# Patient Record
Sex: Female | Born: 1965 | Race: White | Hispanic: No | Smoking: Never smoker
Health system: Southern US, Community
[De-identification: ages and names within clinical notes are randomized; demographics above are authoritative.]

## PROBLEM LIST (undated history)

## (undated) DIAGNOSIS — F32A Depression, unspecified: Secondary | ICD-10-CM

## (undated) DIAGNOSIS — F419 Anxiety disorder, unspecified: Secondary | ICD-10-CM

## (undated) DIAGNOSIS — M25569 Pain in unspecified knee: Secondary | ICD-10-CM

## (undated) DIAGNOSIS — N289 Disorder of kidney and ureter, unspecified: Secondary | ICD-10-CM

## (undated) DIAGNOSIS — E785 Hyperlipidemia, unspecified: Secondary | ICD-10-CM

## (undated) DIAGNOSIS — G8929 Other chronic pain: Secondary | ICD-10-CM

## (undated) DIAGNOSIS — I10 Essential (primary) hypertension: Secondary | ICD-10-CM

## (undated) DIAGNOSIS — G43909 Migraine, unspecified, not intractable, without status migrainosus: Secondary | ICD-10-CM

## (undated) DIAGNOSIS — G709 Myoneural disorder, unspecified: Secondary | ICD-10-CM

## (undated) DIAGNOSIS — E119 Type 2 diabetes mellitus without complications: Secondary | ICD-10-CM

## (undated) DIAGNOSIS — M069 Rheumatoid arthritis, unspecified: Secondary | ICD-10-CM

## (undated) HISTORY — DX: Depression, unspecified: F32.A

## (undated) HISTORY — DX: Hyperlipidemia, unspecified: E78.5

## (undated) HISTORY — PX: ENDOMETRIAL ABLATION: SHX621

## (undated) HISTORY — PX: TUBAL LIGATION: SHX77

## (undated) HISTORY — DX: Essential (primary) hypertension: I10

## (undated) HISTORY — DX: Anxiety disorder, unspecified: F41.9

## (undated) HISTORY — DX: Migraine, unspecified, not intractable, without status migrainosus: G43.909

## (undated) HISTORY — DX: Myoneural disorder, unspecified: G70.9

## (undated) HISTORY — PX: RADIOFREQUENCY ABLATION NERVES: SUR1070

## (undated) HISTORY — DX: Type 2 diabetes mellitus without complications: E11.9

---

## 1998-05-18 ENCOUNTER — Encounter: Admission: RE | Admit: 1998-05-18 | Discharge: 1998-06-20 | Payer: Self-pay | Admitting: Family Medicine

## 1999-05-01 ENCOUNTER — Other Ambulatory Visit: Admission: RE | Admit: 1999-05-01 | Discharge: 1999-05-01 | Payer: Self-pay | Admitting: Obstetrics and Gynecology

## 2001-04-06 ENCOUNTER — Encounter: Payer: Self-pay | Admitting: Emergency Medicine

## 2001-04-06 ENCOUNTER — Emergency Department (HOSPITAL_COMMUNITY): Admission: EM | Admit: 2001-04-06 | Discharge: 2001-04-06 | Payer: Self-pay | Admitting: Emergency Medicine

## 2001-05-28 ENCOUNTER — Ambulatory Visit (HOSPITAL_COMMUNITY): Admission: RE | Admit: 2001-05-28 | Discharge: 2001-05-28 | Payer: Self-pay | Admitting: Obstetrics and Gynecology

## 2001-05-28 ENCOUNTER — Encounter: Payer: Self-pay | Admitting: Obstetrics and Gynecology

## 2001-06-05 ENCOUNTER — Inpatient Hospital Stay (HOSPITAL_COMMUNITY): Admission: AD | Admit: 2001-06-05 | Discharge: 2001-06-05 | Payer: Self-pay | Admitting: Obstetrics and Gynecology

## 2001-06-19 ENCOUNTER — Other Ambulatory Visit: Admission: RE | Admit: 2001-06-19 | Discharge: 2001-06-19 | Payer: Self-pay | Admitting: Obstetrics and Gynecology

## 2001-06-23 ENCOUNTER — Ambulatory Visit (HOSPITAL_COMMUNITY): Admission: RE | Admit: 2001-06-23 | Discharge: 2001-06-23 | Payer: Self-pay | Admitting: Obstetrics and Gynecology

## 2001-06-23 ENCOUNTER — Encounter (INDEPENDENT_AMBULATORY_CARE_PROVIDER_SITE_OTHER): Payer: Self-pay | Admitting: Specialist

## 2001-11-10 ENCOUNTER — Inpatient Hospital Stay (HOSPITAL_COMMUNITY): Admission: AD | Admit: 2001-11-10 | Discharge: 2001-11-10 | Payer: Self-pay | Admitting: Obstetrics and Gynecology

## 2002-06-30 ENCOUNTER — Ambulatory Visit (HOSPITAL_COMMUNITY): Admission: RE | Admit: 2002-06-30 | Discharge: 2002-06-30 | Payer: Self-pay | Admitting: Obstetrics and Gynecology

## 2002-06-30 ENCOUNTER — Encounter: Payer: Self-pay | Admitting: Obstetrics and Gynecology

## 2002-09-10 ENCOUNTER — Inpatient Hospital Stay (HOSPITAL_COMMUNITY): Admission: AD | Admit: 2002-09-10 | Discharge: 2002-09-10 | Payer: Self-pay | Admitting: Obstetrics & Gynecology

## 2002-10-22 ENCOUNTER — Other Ambulatory Visit: Admission: RE | Admit: 2002-10-22 | Discharge: 2002-10-22 | Payer: Self-pay | Admitting: Obstetrics and Gynecology

## 2002-12-31 ENCOUNTER — Encounter: Admission: RE | Admit: 2002-12-31 | Discharge: 2002-12-31 | Payer: Self-pay | Admitting: Gastroenterology

## 2002-12-31 ENCOUNTER — Encounter: Payer: Self-pay | Admitting: Gastroenterology

## 2003-02-25 ENCOUNTER — Inpatient Hospital Stay (HOSPITAL_COMMUNITY): Admission: AD | Admit: 2003-02-25 | Discharge: 2003-02-25 | Payer: Self-pay | Admitting: Obstetrics and Gynecology

## 2003-03-09 ENCOUNTER — Inpatient Hospital Stay (HOSPITAL_COMMUNITY): Admission: AD | Admit: 2003-03-09 | Discharge: 2003-03-09 | Payer: Self-pay | Admitting: Obstetrics and Gynecology

## 2003-03-14 ENCOUNTER — Inpatient Hospital Stay (HOSPITAL_COMMUNITY): Admission: AD | Admit: 2003-03-14 | Discharge: 2003-03-22 | Payer: Self-pay | Admitting: Obstetrics and Gynecology

## 2003-04-06 ENCOUNTER — Inpatient Hospital Stay (HOSPITAL_COMMUNITY): Admission: AD | Admit: 2003-04-06 | Discharge: 2003-04-06 | Payer: Self-pay | Admitting: Obstetrics and Gynecology

## 2003-04-07 ENCOUNTER — Inpatient Hospital Stay (HOSPITAL_COMMUNITY): Admission: AD | Admit: 2003-04-07 | Discharge: 2003-04-07 | Payer: Self-pay | Admitting: Obstetrics and Gynecology

## 2003-04-19 ENCOUNTER — Inpatient Hospital Stay (HOSPITAL_COMMUNITY): Admission: AD | Admit: 2003-04-19 | Discharge: 2003-04-23 | Payer: Self-pay | Admitting: Obstetrics and Gynecology

## 2003-04-24 ENCOUNTER — Encounter: Admission: RE | Admit: 2003-04-24 | Discharge: 2003-05-24 | Payer: Self-pay | Admitting: Obstetrics and Gynecology

## 2003-05-18 ENCOUNTER — Other Ambulatory Visit: Admission: RE | Admit: 2003-05-18 | Discharge: 2003-05-18 | Payer: Self-pay | Admitting: Obstetrics and Gynecology

## 2004-06-25 ENCOUNTER — Inpatient Hospital Stay (HOSPITAL_COMMUNITY): Admission: AD | Admit: 2004-06-25 | Discharge: 2004-06-26 | Payer: Self-pay | Admitting: Obstetrics and Gynecology

## 2004-06-29 ENCOUNTER — Emergency Department (HOSPITAL_COMMUNITY): Admission: EM | Admit: 2004-06-29 | Discharge: 2004-06-29 | Payer: Self-pay | Admitting: Emergency Medicine

## 2004-09-01 ENCOUNTER — Inpatient Hospital Stay (HOSPITAL_COMMUNITY): Admission: AD | Admit: 2004-09-01 | Discharge: 2004-09-01 | Payer: Self-pay | Admitting: Obstetrics & Gynecology

## 2004-09-21 ENCOUNTER — Inpatient Hospital Stay (HOSPITAL_COMMUNITY): Admission: AD | Admit: 2004-09-21 | Discharge: 2004-09-21 | Payer: Self-pay | Admitting: Obstetrics and Gynecology

## 2004-10-09 ENCOUNTER — Inpatient Hospital Stay (HOSPITAL_COMMUNITY): Admission: AD | Admit: 2004-10-09 | Discharge: 2004-10-09 | Payer: Self-pay | Admitting: Obstetrics and Gynecology

## 2004-10-14 ENCOUNTER — Inpatient Hospital Stay (HOSPITAL_COMMUNITY): Admission: AD | Admit: 2004-10-14 | Discharge: 2004-10-15 | Payer: Self-pay | Admitting: Obstetrics and Gynecology

## 2004-10-23 ENCOUNTER — Inpatient Hospital Stay (HOSPITAL_COMMUNITY): Admission: AD | Admit: 2004-10-23 | Discharge: 2004-10-26 | Payer: Self-pay | Admitting: Obstetrics & Gynecology

## 2004-11-25 ENCOUNTER — Emergency Department (HOSPITAL_COMMUNITY): Admission: EM | Admit: 2004-11-25 | Discharge: 2004-11-26 | Payer: Self-pay | Admitting: Emergency Medicine

## 2004-11-27 ENCOUNTER — Encounter: Admission: RE | Admit: 2004-11-27 | Discharge: 2004-11-27 | Payer: Self-pay | Admitting: Obstetrics and Gynecology

## 2005-08-13 ENCOUNTER — Encounter (INDEPENDENT_AMBULATORY_CARE_PROVIDER_SITE_OTHER): Payer: Self-pay | Admitting: Specialist

## 2005-08-13 ENCOUNTER — Ambulatory Visit (HOSPITAL_COMMUNITY): Admission: RE | Admit: 2005-08-13 | Discharge: 2005-08-13 | Payer: Self-pay | Admitting: Obstetrics and Gynecology

## 2006-01-10 ENCOUNTER — Ambulatory Visit: Payer: Self-pay | Admitting: Family Medicine

## 2006-01-13 ENCOUNTER — Ambulatory Visit: Payer: Self-pay | Admitting: Family Medicine

## 2006-01-15 ENCOUNTER — Ambulatory Visit: Payer: Self-pay | Admitting: Family Medicine

## 2006-01-30 ENCOUNTER — Ambulatory Visit: Payer: Self-pay | Admitting: Family Medicine

## 2006-02-12 ENCOUNTER — Ambulatory Visit (HOSPITAL_COMMUNITY): Admission: RE | Admit: 2006-02-12 | Discharge: 2006-02-12 | Payer: Self-pay | Admitting: Obstetrics and Gynecology

## 2006-05-05 ENCOUNTER — Encounter
Admission: RE | Admit: 2006-05-05 | Discharge: 2006-05-05 | Payer: Self-pay | Admitting: Physical Medicine and Rehabilitation

## 2006-11-04 ENCOUNTER — Telehealth (INDEPENDENT_AMBULATORY_CARE_PROVIDER_SITE_OTHER): Payer: Self-pay | Admitting: *Deleted

## 2006-11-24 ENCOUNTER — Encounter (INDEPENDENT_AMBULATORY_CARE_PROVIDER_SITE_OTHER): Payer: Self-pay | Admitting: Obstetrics and Gynecology

## 2006-11-24 ENCOUNTER — Ambulatory Visit (HOSPITAL_COMMUNITY): Admission: RE | Admit: 2006-11-24 | Discharge: 2006-11-24 | Payer: Self-pay | Admitting: Obstetrics and Gynecology

## 2007-02-02 ENCOUNTER — Ambulatory Visit: Payer: Self-pay | Admitting: Family Medicine

## 2007-02-02 DIAGNOSIS — K5909 Other constipation: Secondary | ICD-10-CM | POA: Insufficient documentation

## 2007-02-02 DIAGNOSIS — K5900 Constipation, unspecified: Secondary | ICD-10-CM | POA: Insufficient documentation

## 2007-02-02 LAB — CONVERTED CEMR LAB
Nitrite: NEGATIVE
Protein, U semiquant: NEGATIVE
WBC Urine, dipstick: NEGATIVE
pH: 6

## 2007-02-03 ENCOUNTER — Encounter (INDEPENDENT_AMBULATORY_CARE_PROVIDER_SITE_OTHER): Payer: Self-pay | Admitting: Family Medicine

## 2007-02-05 ENCOUNTER — Encounter (INDEPENDENT_AMBULATORY_CARE_PROVIDER_SITE_OTHER): Payer: Self-pay | Admitting: Family Medicine

## 2007-02-06 ENCOUNTER — Telehealth (INDEPENDENT_AMBULATORY_CARE_PROVIDER_SITE_OTHER): Payer: Self-pay | Admitting: *Deleted

## 2007-03-24 ENCOUNTER — Encounter (INDEPENDENT_AMBULATORY_CARE_PROVIDER_SITE_OTHER): Payer: Self-pay | Admitting: Family Medicine

## 2007-05-21 ENCOUNTER — Encounter: Admission: RE | Admit: 2007-05-21 | Discharge: 2007-05-21 | Payer: Self-pay | Admitting: Obstetrics and Gynecology

## 2007-06-26 ENCOUNTER — Telehealth (INDEPENDENT_AMBULATORY_CARE_PROVIDER_SITE_OTHER): Payer: Self-pay | Admitting: *Deleted

## 2007-06-26 ENCOUNTER — Telehealth: Payer: Self-pay | Admitting: Internal Medicine

## 2007-06-26 ENCOUNTER — Ambulatory Visit: Payer: Self-pay | Admitting: Family Medicine

## 2007-06-26 DIAGNOSIS — H6690 Otitis media, unspecified, unspecified ear: Secondary | ICD-10-CM | POA: Insufficient documentation

## 2007-11-09 ENCOUNTER — Telehealth (INDEPENDENT_AMBULATORY_CARE_PROVIDER_SITE_OTHER): Payer: Self-pay | Admitting: *Deleted

## 2008-03-23 ENCOUNTER — Ambulatory Visit: Payer: Self-pay | Admitting: Family Medicine

## 2008-03-28 ENCOUNTER — Telehealth (INDEPENDENT_AMBULATORY_CARE_PROVIDER_SITE_OTHER): Payer: Self-pay | Admitting: *Deleted

## 2008-03-28 ENCOUNTER — Emergency Department (HOSPITAL_COMMUNITY): Admission: EM | Admit: 2008-03-28 | Discharge: 2008-03-29 | Payer: Self-pay | Admitting: Emergency Medicine

## 2008-03-29 LAB — BASIC METABOLIC PANEL
BUN: 46 — AB (ref 4–21)
Chloride: 102 (ref 99–108)
Creatinine: 2.1 — AB (ref 0.5–1.1)
Glucose: 573
Potassium: 4 mEq/L (ref 3.5–5.1)
Sodium: 140 (ref 137–147)

## 2008-03-29 LAB — CBC AND DIFFERENTIAL
HCT: 45 (ref 36–46)
Hemoglobin: 15.3 (ref 12.0–16.0)

## 2008-07-25 ENCOUNTER — Ambulatory Visit: Payer: Self-pay | Admitting: Diagnostic Radiology

## 2008-07-25 ENCOUNTER — Ambulatory Visit (HOSPITAL_BASED_OUTPATIENT_CLINIC_OR_DEPARTMENT_OTHER): Admission: RE | Admit: 2008-07-25 | Discharge: 2008-07-25 | Payer: Self-pay | Admitting: Obstetrics and Gynecology

## 2009-08-08 ENCOUNTER — Ambulatory Visit: Payer: Self-pay | Admitting: Diagnostic Radiology

## 2009-08-08 ENCOUNTER — Ambulatory Visit (HOSPITAL_BASED_OUTPATIENT_CLINIC_OR_DEPARTMENT_OTHER): Admission: RE | Admit: 2009-08-08 | Discharge: 2009-08-08 | Payer: Self-pay | Admitting: Obstetrics and Gynecology

## 2010-05-27 ENCOUNTER — Encounter: Payer: Self-pay | Admitting: Obstetrics and Gynecology

## 2010-08-02 ENCOUNTER — Other Ambulatory Visit (HOSPITAL_BASED_OUTPATIENT_CLINIC_OR_DEPARTMENT_OTHER): Payer: Self-pay | Admitting: Obstetrics and Gynecology

## 2010-08-02 DIAGNOSIS — Z1231 Encounter for screening mammogram for malignant neoplasm of breast: Secondary | ICD-10-CM

## 2010-08-13 ENCOUNTER — Ambulatory Visit (HOSPITAL_BASED_OUTPATIENT_CLINIC_OR_DEPARTMENT_OTHER): Payer: Self-pay

## 2010-08-14 ENCOUNTER — Ambulatory Visit (HOSPITAL_BASED_OUTPATIENT_CLINIC_OR_DEPARTMENT_OTHER)
Admission: RE | Admit: 2010-08-14 | Discharge: 2010-08-14 | Disposition: A | Discharge status: Home / Self Care | Payer: BC Managed Care – HMO | Source: Ambulatory Visit | Attending: Obstetrics and Gynecology | Admitting: Obstetrics and Gynecology

## 2010-08-14 DIAGNOSIS — Z1231 Encounter for screening mammogram for malignant neoplasm of breast: Secondary | ICD-10-CM | POA: Insufficient documentation

## 2010-09-18 NOTE — Op Note (Signed)
NAME:  Angelica Butler, Angelica Butler NO.:  [id number]   MEDICAL RECORD NO.:  [id number]          PATIENT TYPE:  AMB   LOCATION:  SDC                           FACILITY:  [facility]   PHYSICIAN:  Lenoard Aden, M.D.DATE OF BIRTH:  [birth year]   DATE OF PROCEDURE:  11/24/2006  DATE OF DISCHARGE:                               OPERATIVE REPORT   PREOPERATIVE DIAGNOSIS:  Menorrhagia refractory to hormonal therapy.  History of tubal ligation.   POSTOPERATIVE DIAGNOSIS:  Menorrhagia refractory to hormonal therapy.  History of tubal ligation.   PROCEDURE:  Diagnostic hysteroscopy, D&C, NovaSure endometrial ablation.   SURGEON:  Lenoard Aden, M.D.   ASSISTANT:  None.   ANESTHESIA:  General.   ESTIMATED BLOOD LOSS:  Less than 50 mL.   COMPLICATIONS:  None.   FLUID DEFICIT:  100 mL, endometrial curettings specimen to pathology.   DESCRIPTION OF PROCEDURE:  After being apprised of risks of anesthesia,  infection, bleeding, injury to bowel, bladder with possible need for  repair, uterine perforation, possible need for repair, the patient was  taken to the operating. She was administered general anesthetic without  complications, prepped and draped in sterile fashion.  Feet placed in  the yellow fin stirrups.  Exam under anesthesia revealed a small  anteflexed to mid position uterus.  No adnexal masses at this time. The  cervix is dilated up to #23 [facility] dilator, measurements taken.  Uterus  sounds to 7 cm.  Hysteroscope placed.  Visualization revealed thickened  endometrium.  No structural defects. D&C performed with a sharp  endometrial curettage in four quadrant method.  Revisualization reveals  no evidence of structural abnormalities in the normal appearing cavity.  NovaSure is placed. Device is set per readings as noted with a width of  2.7 cm and a length of 5 cm. At this time the procedure is initiated  after the CO2 test is negative.   Procedure is performed without  difficulty with the entire length of the  procedure lasting 1 minute and 2 seconds.  Revisualization hysteroscope  reveals the cavity to be without evidence of perforation and with a well  ablated endometrial lining, all instruments removed.  No bleeding from  the cervix is noted.  The patient is awakened and transferred to  recovery in good condition.     Lenoard Aden, M.D.  Electronically Signed    RJT/MEDQ  D:  11/24/2006  T:  11/24/2006  Job:  [id number]

## 2010-09-21 NOTE — Op Note (Signed)
NAME:  Angelica Butler, Angelica Butler NO.:  [id number]   MEDICAL RECORD NO.:  [id number]          PATIENT TYPE:  AMB   LOCATION:  SDC                           [facility]   PHYSICIAN:  Lenoard Aden, M.D.DATE OF BIRTH:  1965-12-18   DATE OF PROCEDURE:  08/13/2005  DATE OF DISCHARGE:                                 OPERATIVE REPORT   PREOPERATIVE DIAGNOSIS:  Dysfunction uterine bleeding with secondary anemia.   POSTOPERATIVE DIAGNOSIS:  Dysfunction uterine bleeding with secondary anemia  plus endometrial polyps.   PROCEDURE:  Diagnostic hysteroscopy, dilatation and curettage, resectoscopic  polypectomy.   SURGEON:  Lenoard Aden, M.D.   ANESTHESIA:  General.   ESTIMATED BLOOD LOSS:  Less than 50 cc.   COMPLICATIONS:  None.   DRAINS:  None.   COUNTS:  Correct.   Patient to recovery in good condition.   BRIEF OPERATIVE NOTE:  After being apprised the risks of anesthesia,  infection, bleeding, uterine perforation, and possible need for repair, the  patient was taken to the operating room, where she was administered general  anesthetic.  No complications.  Prepped and draped usual sterile fashion.  A  catheter was inserted__________ into the bladder and was emptied after  achieving adequate anesthesia.  The patient was straight-cathed until the  bladder was empty.  A single-tooth tenaculum was placed to the anterior lip  of the cervix.  Dilute Nesacaine solution placed, standard paracervical  block.  The cervix was easily dilated up to a #31 Pratt dilator.  Hysteroscope placed.  Visualization revealed some multiple small endometrial  polyps along the anterior uterine wall, which were resected without  difficulty.  D&C is performed.  Repeat visualization reveals empty  endometrial cavity, fluid deficit to 125 cc.  The patient tolerated the  procedure well, was awakened and transferred to recovery in good condition.      Lenoard Aden, M.D.  Electronically Signed     RJT/MEDQ  D:  08/13/2005  T:  08/13/2005  Job:  [id number]

## 2010-09-21 NOTE — Op Note (Signed)
[facility]  Patient:    Angelica Butler, Angelica Butler Visit Number: [id number] MRN: [id number]          Service Type: DSU Location: [facility] Attending Physician:  Michael Litter Dictated by:   Jaymes Graff, M.D. Proc. Date: 06/23/01 Admit Date:  06/23/2001                             Operative Report  PREOPERATIVE DIAGNOSIS:       Missed abortion at 10+ weeks.  POSTOPERATIVE DIAGNOSIS:      Missed abortion at 10+ weeks.  OPERATION:                    Dilatation and evacuation.  SURGEON:                      Leona Carry, M.D.  ASSISTANT:  ANESTHESIA:                   MAC.  ESTIMATED BLOOD LOSS:         Minimal.  IV FLUIDS:                    1 liter of crystalloid.  COMPLICATIONS:                None.  FINDINGS:                     8-week size anteverted uterus with no adnexal masses.  Normal vulvovaginal examination.  Products of conception sent to pathology.  DESCRIPTION OF PROCEDURE:     The patient was taken to the operating room where she was given MAC anesthesia and 10 cc of 1% lidocaine cervical block and placed in the dorsal lithotomy position and prepped and draped in the usual sterile fashion.  A bivalve speculum was placed into the vagina. The anterior lip of the cervix was grasped with a single tooth tenaculum.  The cervix was then dilated with Shawnie Pons dilators to a size 21.  A size 7 suction curettage was placed into the uterus and done until a gritty texture was noted. The suction curettage was removed and a sharp curet was then placed into the uterus and curetted until a gritty texture was noted.  The curet was then placed once again into the uterus to evacuate all blood and no other products of conception was seen.  All instruments were removed from the vagina. The tenaculum was removed with good hemostasis noted.  Sponge, needle, and instrument counts were correct x 2.  The patient went to the recovery room in stable  condition. Dictated by:   Jaymes Graff, M.D. Attending Physician:  Michael Litter DD:  06/23/01 TD:  06/23/01 Job: [id number]

## 2010-09-21 NOTE — Discharge Summary (Signed)
NAME:  Angelica Butler, Angelica Butler                 ACCOUNT NO.:  [id number]   MEDICAL RECORD NO.:  [id number]          PATIENT TYPE:  INP   LOCATION:  9108                          [facility]ILITY[facility]   PHYSICIAN:  Lenoard Aden, M.D.DATE OF BIRTH:  [birth year]   DATE OF ADMISSION:  10/23/2004  DATE OF DISCHARGE:  10/26/2004                                 DISCHARGE SUMMARY   The patient underwent induction for gestational hypertension, now at term.  The patient, during labor, evolved with a compound presentation with a hand  presentation that was nonreducible.  She underwent urgent primary C section  without complications.  Postop course uncomplicated.  Discharged to home day  #3.  Tylox and prenatal vitamins given.  Follow up in the office in 4-6  weeks.      Lenoard Aden, M.D.  Electronically Signed     RJT/MEDQ  D:  12/31/2004  T:  12/31/2004  Job:  [id number]   cc:   Floyde Parkins

## 2010-09-21 NOTE — H&P (Signed)
NAME:  Angelica Butler, Angelica Butler                            ACCOUNT NO.:  [id number]   MEDICAL RECORD NO.:  [id number]                   PATIENT TYPE:  INP   LOCATION:  9172                                 [facility]ILITY[facility]   PHYSICIAN:  Lenoard Aden, M.D.             DATE OF BIRTH:  [birth year]   DATE OF ADMISSION:  04/19/2003  DATE OF DISCHARGE:                                HISTORY & PHYSICAL   CHIEF COMPLAINT:  Mild preeclampsia, for induction.   HISTORY OF PRESENT ILLNESS:  The patient is a 45 year old white female (G3,  P0) with an EDD of May 14, 2003; with stable, mild preeclampsia since 32  weeks.  She presents now at 36 weeks for induction.   ALLERGIES:  NO KNOWN DRUG ALLERGIES.   MEDICATIONS:  Prenatal vitamins.   PAST MEDICAL HISTORY:  History of complete AB, SAB and D&E in 2002 and 2003.  She has history of anxiety and depression.  History of liposuction.  History  of medication for anxiety and depression.   FAMILY HISTORY:  Heart disease, blood clots, breast cancer and psychiatric  disease.   PRENATAL LABS:  Blood type 0 positive.  Rh antibody negative.  Rubella  immune.  Hepatitis surface antigen negative.  HIV nonreactive.   PHYSICAL EXAMINATION:  GENERAL:  She is a well developed, well nourished  white female; no acute distress.  HEENT:  Normal.  LUNGS:  Clear.  HEART:  Regular rate and rhythm.  ABDOMEN:  Soft, gravid and nontender.  FETAL:  Estimated fetal weight 6 pounds.  GENITOURINARY:  Cervix is 2 cm, 80% vertex, minus 1.  EXTREMITIES:  Without cords.  NEUROLOGIC:  Nonfocal.   IMPRESSION:  Mild preeclampsia, now at term.   PLAN:  Proceed with cervical ripening and induction.                                               Lenoard Aden, M.D.    RJT/MEDQ  D:  04/20/2003  T:  04/20/2003  Job:  [id number]

## 2010-09-21 NOTE — Assessment & Plan Note (Signed)
[facility]                                   ON-CALL NOTE   NAME:Adrian, Angelica Butler                        MRN:          [id number]  DATE:01/24/2006                            DOB:          October 23, 1965    On-call note for [name], now an Angelica Butler patient.  The patient's date  of birth is [birth year], [phone], about 5:20 on the 21st.  The patient  calls because her primary doctor's nurses called her, before she picked up  on the last ring, and called and left a message to say they need to know her  pharmacy number so they can call in a prescription for her pain, as she is  under evaluation for fever, back pain and other issues, and when she called  the office back she could not get through, of course, because the phones  were closed by then.  I called the [facility] office back number, and they  will call her directly at this number to make appropriate arrangements for  her prescription.                                   Neta Mends. Fabian Sharp, MD   WKP/MedQ  DD:  01/24/2006  DT:  01/28/2006  Job #:  [id number]   cc:   Leanne Chang, M.D.

## 2010-09-21 NOTE — H&P (Signed)
[facility]  Patient:    Angelica Butler, Angelica Butler Visit Number: [id number] MRN: [id number]          Service Type: Attending:  Naima A. Normand Sloop, M.D. Dictated by:   Pierre Bali. Normand Sloop, M.D. Adm. Date:  06/23/01                           History and Physical  OFFICE NUMBER:                [id number].  HISTORY OF PRESENT ILLNESS:   The patient is a 45 year old, G2, P0-0-1-0, with an estimated date of confinement of January 15, 2002, by early ultrasound, estimated gestational age of [redacted] weeks 4 days.  The patient had an ultrasound on June 19, 2001, secondary to not being able to auscultate fetal heart tones during exam.  She was then diagnosed with a missed abortion of twins. Neither twin had a heartbeat, and she had a previous ultrasound showing fetal cardiac activity.  The patient had no vaginal bleeding or cramping.  PAST OBSTETRIC HISTORY:       1. Spontaneous abortion on the first trimester.                               2. No history of D&E.  PAST GYNECOLOGIC HISTORY:     1. She had menarche at age 85, occurring every                                  month, lasting for five days.                               2. No history of sexual transmitted diseases or                                  abnormal Pap smear.  PAST MEDICAL HISTORY:         Depression.  MEDICATIONS:                  1. Celexa.                               2. Folic acid.  ALLERGIES:                    No known drug allergies.  SOCIAL HISTORY:               Negative x 3.  FAMILY HISTORY:               Paternal grandmother with ovarian and breast cancer.  Also family history significant for hypertension.  No diabetes.  PHYSICAL EXAMINATION:  VITAL SIGNS:                  Blood pressure 120/86.  The patient is crying secondary to the emotional distress of her loss.  HEART:                        Regular.  LUNGS:  Clear to auscultation bilaterally.  ABDOMEN:                       Nondistended, soft, and nontender.  PELVIC:                       Vulva within normal limits.  Patient refuses speculum exam.  Uterus is eight week size, nontender.  No adnexal masses palpated.  Cervix is long and closed.  EXTREMITIES:                  No clubbing, cyanosis, or edema.  LABORATORY DATA:              Her blood type is O positive, antibody negative. Hemoglobin 13.5, platelet count 196.  She had an ultrasound here on June 19, 2001, which crown-rump length measured only 7 weeks 2 days with no fetal heart activity.  Right ovary was 2.7 x 2.0 x 2.0 cm.  The left ovary was not seen.  Twin B also had no fetal cardiac activity, measuring 7 weeks two days, and both twins seemed amorphus in shape.  ASSESSMENT AND PLAN:          The patient was told that she had a missed abortion and given the option of expectant management versus D&E, the patient chose to have a D&E.  She understands that the risks are but not limited to bleeding, infection, perforation of the uterus, and Asherman syndrome.  The patient still chose to have a D&E and understands the risks involved. Dictated by:   Pierre Bali. Normand Sloop, M.D. Attending:  Naima A. Dillard, M.D. DD:  06/23/01 TD:  06/23/01 Job: [id number]

## 2010-09-21 NOTE — Consult Note (Signed)
   NAME:  Angelica Butler, Angelica Butler                            ACCOUNT NO.:  [id number]   MEDICAL RECORD NO.:  [id number]                   PATIENT TYPE:  MAT   LOCATION:  MATC                                 [facility]ILITY:  [facility]   PHYSICIAN:  Lenoard Aden, M.D.             DATE OF BIRTH:  [birth year]   DATE OF CONSULTATION:  03/09/2003  DATE OF DISCHARGE:                                   CONSULTATION   CHIEF COMPLAINT:  Increased blood pressure and shortness of breath.   HISTORY OF PRESENT ILLNESS:  The patient is a 45 year old white female G3,  P0 at 30-4/7ths' weeks who presents to the office today with facial edema,  proteinuria, slightly elevated blood pressure and shortness of breath.   PAST OBSTETRICAL HISTORY:  Remarkable for SAB x2.   ALLERGIES:  NO KNOWN DRUG ALLERGIES.   MEDICATIONS:  Celexa and prenatal vitamins.   PAST MEDICAL HISTORY:  Remarkable for anxiety and depression.  No other  medical or surgical hospitalizations.  Personal history of liposuction.   FAMILY HISTORY:  Depression, myocardial infarction, venous thromboembolism.   PRENATAL LABORATORY DATA:  Blood type 0 positive, RPR negative, Rubella  immune, hepatitis and HIV negative.   PHYSICAL EXAMINATION:  VITAL SIGNS:  Blood pressure 120-130's/80-89.  GENERAL:  She is a well-developed, well nourished white female in no acute  distress.  HEENT:  Normal.  LUNGS:  Clear.  CARDIOVASCULAR:  Regular rate and rhythm.  ABDOMEN:  Soft, gravid, nontender. No right upper quadrant tenderness.  EXTREMITIES: With 2+ pretibial edema noted.  DTRs 2+, no evidence of clonus.  PELVIC:  Deferred.  NEUROLOGIC:  Nonfocal.   LABORATORY DATA:  CBC revealed white blood cell count 8.1, hemoglobin 11.3,  platelet count 154,000. SGOT, PT within normal limits. Creatinine 0.6, LDH  109, uric acid 2.9. NST reactive.  No evidence of contractions are noted.   IMPRESSION:  1. A 30-4/7ths' weeks intrauterine pregnancy.  2. No evidence of  preeclampsia with normal labs and normal serial blood     pressures.  3. Anxiety/depression, stable.  4. Probable upper respiratory infection.   PLAN:  Treat with Z-pak and Tussionex for her cough. Continue Celexa. Start  24 hour urine.  Discontinue work x24-48 hours. Follow up in the office in 48  hours.  Preeclamptic precautions given. Will check 24 hour urine upon  presentation at the next visit.  Hypertensive precautions given.                                               Lenoard Aden, M.D.    RJT/MEDQ  D:  03/09/2003  T:  03/09/2003  Job:  [id number]   cc:   [facility]

## 2010-09-21 NOTE — H&P (Signed)
NAME:  Angelica Butler, Angelica Butler                 ACCOUNT NO.:  [id number]   MEDICAL RECORD NO.:  [id number]          PATIENT TYPE:  INP   LOCATION:  9174                          [facility]ACILITY:  [facility]   PHYSICIAN:  Lenoard Aden, M.D.DATE OF BIRTH:  1965/08/05   DATE OF ADMISSION:  10/23/2004  DATE OF DISCHARGE:                                HISTORY & PHYSICAL   CHIEF COMPLAINT:  Worsening headache, history of PIH, for induction.   HISTORY OF PRESENT ILLNESS:  She is a 45 year old white female, G4, P28, EDD  of November 08, 2004, with a multitude of chronic problems during pregnancy, to  include chronic recurrent headache, cough, back pain, and mild elevation of  blood pressure, for induction now at term.   She has a history of a spontaneous vaginal delivery, a vacuum-assisted  vaginal delivery in 2004, SAB x2, history of liposuction in 2002, history of  pregnancy-induced hypertension with her first pregnancy.   She has a family history of diabetes and hypertension and alcohol abuse, a  family history of bipolar disorder.   PRENATAL LABORATORY DATA:  Blood type of O positive.  Rubella immune.  Hepatitis, HIV negative.  GBS is negative.   PHYSICAL EXAMINATION:  GENERAL:  She is a well-developed, well-nourished  white female in no acute distress.  HEENT:  Normal.  CHEST:  Lungs clear.  CARDIAC:  Regular rhythm.  ABDOMEN:  Soft, gravid, nontender.  Estimated fetal weight 7 pounds.  PELVIC:  Cervix is 2 cm, 50%, vertex, -2.  EXTREMITIES:  No cords.  NEUROLOGIC:  Exam is nonfocal.   IMPRESSION:  1.  A 38-week intrauterine pregnancy.  2.  Chronic recurrent refractory headache, intractable, with no response to      medications and negative workup noted.   PLAN:  Proceed with induction.  Risks and benefits discussed.  Epidural as  needed.       RJT/MEDQ  D:  10/23/2004  T:  10/23/2004  Job:  [id number]

## 2010-09-21 NOTE — Op Note (Signed)
NAME:  EVERLEAN, BUCHER NO.:  [id number]   MEDICAL RECORD NO.:  [id number]          PATIENT TYPE:  AMB   LOCATION:  SDC                           [facility]   PHYSICIAN:  Lenoard Aden, M.D.DATE OF BIRTH:  1965-07-11   DATE OF PROCEDURE:  02/12/2006  DATE OF DISCHARGE:                                 OPERATIVE REPORT   PREOPERATIVE DIAGNOSIS:  Elective sterilization.   POSTOPERATIVE DIAGNOSIS:  Elective sterilization.   PROCEDURE:  Laparoscopic tubal sterilization.   SURGEON:  Lenoard Aden, M.D.   ANESTHESIA:  General.   ESTIMATED BLOOD LOSS:  Less than 50 cc.   COMPLICATIONS:  None.   DRAINS:  None.   COUNTS:  Correct.   Patient to recovery in good condition.   OPERATIVE NOTE:  After being apprised of risks of anesthesia, infection,  bleeding, intra-abdominal organs need for repair, delayed versus immediate  complications, to include bowel and bladder injury with possible need for  repair, failure risk of tubal ligation, 5-02/999, irreversibility of the  procedure, patient is brought to the operating room, where she is  administered general anesthetic without complications.  Prepped and draped  in the usual sterile fashion.  Feet are placed in the yellow fin stirrups.  Hulka tenaculum placed per vagina.  An infraumbilical incision made with the  scalpel after placement of dilute Marcaine solution.  A Veress needle  placed.  Opening pressure of -1 noted, and 3.5 liters of CO2 insufflated  without difficulty.  A trocar placed.  Atraumatic trocar entry noted.  Liver  and gallbladder bed appear normal.  Normal appendiceal area.  Normal uterus,  tubes, and ovaries.  Normal anterior and posterior cul de sac noted.  The  Kleppinger bipolar electrocautery is entered through the operative port, and  the right tube is traced out to the fimbriated end, cauterized to a  resistance of 0 in three contiguous portions of the ampullary isthmic  portion  of the tube and divided  using scissors.  Tubal lumens are  visualized.  The exact same procedure is done on the left tube, and lumens  are visualized as well.  Both normal ovaries are also appreciated at this  time.  Good hemostasis is noted.  CO2 is released.  Good hemostasis is  appreciated.  All instruments are  removed from the abdomen.  The incision is closed using a 0 Vicryl  __________ suture and Dermabond on the skin.  The tenaculum is removed from  the vagina.  The patient tolerated the procedure well and is transferred to  recovery in good condition.      Lenoard Aden, M.D.  Electronically Signed     RJT/MEDQ  D:  02/12/2006  T:  02/13/2006  Job:  [id number]

## 2010-09-21 NOTE — Op Note (Signed)
NAME:  Angelica Butler, Angelica Butler                            ACCOUNT NO.:  [id number]   MEDICAL RECORD NO.:  [id number]                   PATIENT TYPE:  INP   LOCATION:  9120                                 [facility]   PHYSICIAN:  Lenoard Aden, M.D.             DATE OF BIRTH:  08-17-1965   DATE OF PROCEDURE:  04/21/2003  DATE OF DISCHARGE:                                 OPERATIVE REPORT   OPERATIVE DELIVERY NOTE:   INDICATION FOR OPERATIVE DELIVERY:  Maternal temperature, prolonged second  stage.   BRIEF OPERATIVE NOTE:  After being apprised of the risks of vacuum to  include cephalohematoma, scalp laceration, and intracranial hemorrhage, a  Kiwi cup was placed on fully-dilated vertex, LOA less than 45 degrees, +4  station, Kiwi cup x1 pull for a term living female over a second degree  midline laceration, Apgars 8 and 9.  The placenta delivered spontaneously  intact with a three-vessel cord after bulb-suction.  It was noted no  complications.  Estimated blood loss 300 mL.  Repair with a 3-0 Rapide.  No  cervical lacerations noted.  The patient tolerates the procedure well,  recovering in good condition.                                               Lenoard Aden, M.D.    RJT/MEDQ  D:  04/21/2003  T:  04/21/2003  Job:  [id number]

## 2010-09-21 NOTE — H&P (Signed)
   NAME:  Angelica Butler, Angelica Butler                            ACCOUNT NO.:  [id number]   MEDICAL RECORD NO.:  [id number]                   PATIENT TYPE:   LOCATION:                                       [facility]   PHYSICIAN:  Lenoard Aden, M.D.             DATE OF BIRTH:  1966/01/11   DATE OF ADMISSION:  03/14/2003  DATE OF DISCHARGE:                                HISTORY & PHYSICAL   CHIEF COMPLAINT:  Rule out preeclampsia.   HISTORY OF PRESENT ILLNESS:  The patient is a 45 year old white female, G3,  P0, EDD of May 18, 2003, currently at 30-4/7 weeks, who presents for  admission to rule out preeclampsia.   She has past medical history which was remarkable for depression and  anxiety, no surgical history, obstetric history remarkable for two SABs, one  without D&E and one with D&E.   She has no known drug allergies.   Medications at this time to include Zantac, Pepcid, and Zoloft.   Pregnancy has been complicated by preterm contractions without evidence of  cervical change and recent elevation of blood pressure with proteinuria.  She was seen four days ago in the hospital with normal PIH labs, 24-hour  urine has been drawn and is pending.   PHYSICAL EXAMINATION:  GENERAL:  She is a well-developed, well-nourished in  no acute distress.  VITAL SIGNS:  Blood pressure 152/90, 100 protein on proteinuria dip, weight  of 184.  HEENT:  Normal.  CHEST:  Lungs clear.  CARDIAC:  Regular rate and rhythm.  ABDOMEN:  Soft, gravid to 32 weeks, nontender.  PELVIC:  Cervix closed, 2.5-3 cm long.  EXTREMITIES:  DTRs 3+, no evidence of clonus.  NEUROLOGIC:  Intact.   IMPRESSION:  1. Thirty-week obstetric patient.  2. Rule out preeclampsia.   PLAN:  Admission to [facility] for serial labs, blood pressure  monitoring, fetal surveillance.  Will administer betamethasone series, do  ultrasound to follow up with fetal growth.                                               Lenoard Aden, M.D.    RJT/MEDQ  D:  03/14/2003  T:  03/14/2003  Job:  [id number]

## 2010-09-21 NOTE — Assessment & Plan Note (Signed)
[facility]                          [place] OFFICE NOTE   NAME:Angelica Butler, Angelica Butler                        MRN:          [id number]  DATE:01/10/2006                            DOB:          09-Jun-1965    Angelica Butler is a 45 year old female initially presenting for a sore throat,  but then revealed that her major concern was the following.   The patient reports that for the last 3 months she has been having upper  back pain associated body aches, fatigue, and intermittent low-grade  temperature of 99.9.  She reports that the body aches and fatigue usually  hit her around 2:00 or 3:00 in the afternoon.  These symptoms are not  constant but are recurrent.  Active throughout the day due to taking care of  her children.  She denies any family history of autoimmune disease including  lupus, rheumatoid arthritis, or Sjogren's.  She was started by her  gynecologist on Adipex.  She does report that occasionally her hands, feet,  and her fingers feel swollen.  Again, her symptoms are not usually apparent  in the morning.   The patient also complained of a sore throat and coughing for the last 2-3  days.  Her one-year-old son also had similar symptoms.   PAST MEDICAL HISTORY:  1. Depression.  2. Anxiety.  3. Chronic back pain, seen by a chiropractor.   MEDICATIONS:  1. Lexapro 10 mg daily.  2. Wellbutrin 300 mg daily.  3. Adipex 37.5 mg.  4. Xanax.   ALLERGIES:  NO KNOWN DRUG ALLERGIES.   FAMILY HISTORY:  Father passed away secondary to metastatic prostate cancer.  Mother alive with a history of type diabetes and hypertension.  She has two  brothers who also have hypertension.   SOCIAL HISTORY:  She is married with two young children.  She denies any  alcohol or tobacco use.   REVIEW OF SYSTEMS:  As per HPI, otherwise unremarkable.   OBJECTIVE:  VITAL SIGNS:  Weight 148.6.  Temperature 97.2.  Pulse 80.  Blood  pressure 102/60.  GENERAL:   She is a pleasant female in no acute distress.  Answers questions  appropriately.  Alert and oriented x3.  HEENT:  Nasal mucosa was boggy with clear nasal discharge.  Oropharynx was  mildly erythematous with postnasal drip.  NECK:  Supple.  No lymphadenopathy, carotid bruits, or JVD.  No thyromegaly  was noted.  LUNGS:  Clear.  HEART:  Regular rate and rhythm.  No murmurs, gallops, or rubs.  EXTREMITIES:  No cyanosis, clubbing, or edema.  SPINE:  Examination of the spine significant for no midline tenderness.  She  does have tenderness over the trapezius muscles bilaterally.  JOINTS:  Surveillance of the joints significant for no synovitis, swelling,  crepitus, or deformity.  She has full range of motion of the major joints  including the shoulders, elbows, wrists, hips, knees, and ankles.  NEUROLOGIC:  Examination was nonfocal.   Strep test was negative.   IMPRESSION:  1. This is a 45 year old female presenting with body aches, myalgias, and  fatigue for the last three months. Symptoms maybe more related to her      lifestyle; although, other etiologies need to be ascertained.  2. Upper respiratory infection.   PLAN:  1. Will obtain a sed rate, ANA, comprehensive metabolic profile,      rheumatoid factor, CBC with diff, TSH, T3 and T4.  2. Further recommendations after review of laboratory data.  3. Advised the patient to discuss with her gynecologist the possibility of      discontinuing Adipex given that her symptoms presented themselves      around the same time Adipex was started.  4. In regards to her upper respiratory infection, recommended Entex PSE, 1      tablet q.a.m. and Tussionex 1 teaspoon q.h.s.  Side effects were      reviewed with the patient.  She is advised if her URI symptoms do not      improve in the next 5-7 days, she is to followup.  The patient      expressed understanding.                                   Leanne Chang, M.D.   LA/MedQ  DD:   01/10/2006  DT:  01/11/2006  Job #:  [id number]

## 2010-09-21 NOTE — Op Note (Signed)
NAME:  CRISTABEL, BICKNELL                 ACCOUNT NO.:  [id number]   MEDICAL RECORD NO.:  [id number]          PATIENT TYPE:  INP   LOCATION:  9108                          FACILITY:  [facility]   PHYSICIAN:  Lenoard Aden, M.D.DATE OF BIRTH:  [birth year]   DATE OF PROCEDURE:  10/23/2004  DATE OF DISCHARGE:                                 OPERATIVE REPORT   PREOPERATIVE DIAGNOSIS:  Compound presentation with nonreducible forearm  presenting at risk for fetal injury for primary C-section.   POSTOPERATIVE DIAGNOSIS:  Compound presentation with nonreducible forearm  presenting at risk for fetal injury for primary C-section.   PROCEDURE:  Primary low C-section.   SURGEON:  Dr. Billy Coast   ANESTHESIA:  Epidural.   ESTIMATED BLOOD LOSS:  1000 mL.   COMPLICATIONS:  None.   DRAINS:  Foley.   COUNTS:  Correct.  The patient to recovery in good condition.   FINDINGS:  Full-term living female, Apgars 69 and 33.  Pediatricians in  attendance.  Presenting left forearm noted without evidence of immediate  damage postdelivery.  Placenta anterior, intact, three-vessel cord noted,  normal tubes and ovaries.   BRIEF OPERATIVE NOTE:  After being apprised of the risks of anesthesia,  infection, bleeding, possibility for fetal injury if not proceeding with C-  section.  The patient is brought to the operating room where she is  administered a dosing of her epidural without complications, prepped and  draped in usual sterile fashion, Foley catheter previously placed.  Marcaine  solution placed in the area and Pfannenstiel skin incision made with the  scalpel, carried down to the fascia, which is nicked in the midline and  opened transversely using Mayo scissors.  Rectus muscle is dissected sharply  and peritoneum entered sharply, bladder blade placed.  Visceroperitoneum is  scored in a smile-like fashion, dissected sharply off the lower uterine  segment, a Kerr hysterotomy incision made with the scalpel,  atraumatically  delivery of a full-term living female from previously noted position, Apgars 9  and 9, pediatricians in attendance, cord blood collected, placenta delivered  manually intact.  Uterus is curetted using a dry lap pack and closed in two  layers using a 0 Monocryl in continuous running suture, bladder flap  inspected and found to be hemostatic.  Two interrupted sutures placed in the  midline for hemostasis.  Figure-of-eight sutures in the uterine incision  with good hemostasis noted.  Paracolic gutters irrigated, all blood clots  subsequently removed.  Uterus has contracted well, and  normal tubes and ovaries are palpable as noted and visualized.  Fascia then  closed using a 0 Monocryl in continuous running fashion.  Skin closed using  staples.  The patient tolerates the procedure well and is transferred to  recovery in good condition.       RJT/MEDQ  D:  10/23/2004  T:  10/23/2004  Job:  [id number]

## 2010-09-21 NOTE — Consult Note (Signed)
NAME:  Angelica Butler, Angelica Butler NO.:  [id number]   MEDICAL RECORD NO.:  [id number]          PATIENT TYPE:  MAT   LOCATION:  MATC                          FACILITY:  [facility]   PHYSICIAN:  Lenoard Aden, M.D.DATE OF BIRTH:  [birth year]   DATE OF CONSULTATION:  DATE OF DISCHARGE:                                   CONSULTATION   CONSULTING PHYSICIAN:  Lenoard Aden, M.D.   CHIEF COMPLAINT:  Headache.   HISTORY OF PRESENT ILLNESS:  The patient is a 45 year old white female, G4,  P42, EDD November 04, 2004 per adjusted LMP at 11 weeks' gestation with worsening  headache today.  Previous history of pre-eclampsia with previous pregnancy.  Blood pressure 110/80 with no proteinuria in the office.   She has no known drug allergies.   MEDICATIONS:  Prenatal vitamins.   The pregnancy has been complicated by persistent chronic headache with no  improvement, status post lumbar puncture, status post head CT scan.   PREVIOUS OBSTETRIC HISTORY:  Remarkable for SAB 2002, SAB 2003, and a  spontaneous vaginal delivery of a female in 2004.   1.  She has a history of depression, currently on Celexa.  2.  History of liposuction.  3.  History of hyperemesis with pregnancy.   FAMILY HISTORY:  Hypertension, DVT, and myocardial infarction.   PHYSICAL EXAMINATION:  GENERAL:  She is a well-developed, well-nourished,  white female in mild amount of distress.  HEENT:  Normal.  LUNGS:  Clear.  HEART:  Regular rhythm.  ABDOMEN:  Soft, gravid, nontender.  No right upper quadrant tenderness.  EXTREMITIES:  DTRs 2 to 3+.  No evidence of clonus.  Trace pretibial edema  noted.  NEUROLOGIC:  Nonfocal.  PELVIC:  Previous cervical exam reveals the cervix to be 2-cm dilated  __________  vertex, -2.   IMPRESSION:  1.  A 36-week intrauterine pregnancy.  2.  Headache with normal blood pressure but with a history of pre-eclampsia.  3.  History of chronic headache.   PLAN:  1.  Proceed with PIH panel.  2.  Check NST.  3.  We will administer Stadol 2 mg IM with Phenergan if NST is reactive.  4.  Pending PIH panel, we will triage the patient appropriately.      RJT/MEDQ  D:  10/09/2004  T:  10/09/2004  Job:  [id number]   cc:   Ma Hillock

## 2010-09-21 NOTE — Discharge Summary (Signed)
NAME:  Angelica Butler, Angelica Butler                            ACCOUNT NO.:  [id number]   MEDICAL RECORD NO.:  [id number]                   PATIENT TYPE:  INP   LOCATION:  9159                                 FACILITY:  [facility]   PHYSICIAN:  Lenoard Aden, M.D.             DATE OF BIRTH:  12/21/1965   DATE OF ADMISSION:  03/14/2003  DATE OF DISCHARGE:  03/22/2003                                 DISCHARGE SUMMARY   Patient admitted for mild pre-eclampsia, remained stable, fetal surveillance  reassuring.  BOPP of 8 out of 8.  All labs within normal limits.  Proteinuria on repeat 1 __________ decreased under 1 gram.  She is  discharged to home for mild pre-eclamptic precautions.  She is followed up  daily at home by home health care nursing with twice weekly NSTs, twice  weekly BPPs in the office and daily blood pressure checks.  She has a blood  pressure monitor at home.  She is given strict pre-eclamptic warnings.   DISCHARGE MEDICATIONS:  Prenatal vitamins.   Follow up in the office twice a week as previously noted with home health  care nursing.  Specific guidelines regarding home pre-eclamptic monitoring  discussed.  Guidelines from __________ are noted for the outpatient  management of pre-eclampsia.                                               Lenoard Aden, M.D.    RJT/MEDQ  D:  04/16/2003  T:  04/16/2003  Job:  [id number]

## 2011-01-09 ENCOUNTER — Emergency Department (HOSPITAL_COMMUNITY)
Admission: EM | Admit: 2011-01-09 | Discharge: 2011-01-09 | Disposition: A | Discharge status: Home / Self Care | Payer: BC Managed Care – HMO | Attending: Emergency Medicine | Admitting: Emergency Medicine

## 2011-01-09 DIAGNOSIS — F411 Generalized anxiety disorder: Secondary | ICD-10-CM | POA: Insufficient documentation

## 2011-01-09 DIAGNOSIS — G894 Chronic pain syndrome: Secondary | ICD-10-CM | POA: Insufficient documentation

## 2011-01-09 DIAGNOSIS — I10 Essential (primary) hypertension: Secondary | ICD-10-CM | POA: Insufficient documentation

## 2011-01-09 DIAGNOSIS — R197 Diarrhea, unspecified: Secondary | ICD-10-CM | POA: Insufficient documentation

## 2011-01-09 DIAGNOSIS — F1120 Opioid dependence, uncomplicated: Secondary | ICD-10-CM | POA: Insufficient documentation

## 2011-01-09 LAB — COMPREHENSIVE METABOLIC PANEL
Alkaline Phosphatase: 55 U/L (ref 39–117)
BUN: 9 mg/dL (ref 6–23)
Calcium: 9.5 mg/dL (ref 8.4–10.5)
Creatinine, Ser: 0.63 mg/dL (ref 0.50–1.10)
GFR calc Af Amer: >60 mL/min (ref >60)
Glucose, Bld: 118 mg/dL — ABNORMAL HIGH (ref 70–99)
Potassium: 3.5 mEq/L (ref 3.5–5.1)
Total Protein: 7.1 g/dL (ref 6.0–8.3)

## 2011-01-09 LAB — DIFFERENTIAL
Lymphocytes Relative: 27 % (ref 12–46)
Monocytes Absolute: 0.2 10*3/uL (ref 0.1–1.0)
Monocytes Relative: 4 % (ref 3–12)
Neutro Abs: 3.8 10*3/uL (ref 1.7–7.7)

## 2011-01-09 LAB — RAPID URINE DRUG SCREEN, HOSP PERFORMED
Benzodiazepines: POSITIVE — AB
Opiates: NOT DETECTED

## 2011-01-09 LAB — CBC
HCT: 41.9 % (ref 36.0–46.0)
Hemoglobin: 13.9 g/dL (ref 12.0–15.0)
MCH: 29.4 pg (ref 26.0–34.0)
MCHC: 33.2 g/dL (ref 30.0–36.0)
MCV: 88.6 fL (ref 78.0–100.0)

## 2011-01-09 LAB — URINALYSIS, ROUTINE W REFLEX MICROSCOPIC
Bilirubin Urine: NEGATIVE
Hgb urine dipstick: NEGATIVE
Ketones, ur: NEGATIVE mg/dL
Specific Gravity, Urine: 1.017 (ref 1.005–1.030)
pH: 8 (ref 5.0–8.0)

## 2011-01-09 LAB — ETHANOL: Alcohol, Ethyl (B): <11 mg/dL (ref 0–11)

## 2011-02-05 LAB — CBC
Hemoglobin: 12.6
MCHC: 33.9
MCV: 89.7
RBC: 4.16

## 2011-02-05 LAB — POCT I-STAT, CHEM 8
Calcium, Ion: 1.28
Creatinine, Ser: 2.1 — ABNORMAL HIGH
Glucose, Bld: 573
Hemoglobin: 15.3 — ABNORMAL HIGH
TCO2: 27

## 2011-02-05 LAB — DIFFERENTIAL
Basophils Relative: 0
Eosinophils Absolute: 0.1
Monocytes Absolute: 0.2
Monocytes Relative: 3
Neutrophils Relative %: 87 — ABNORMAL HIGH

## 2011-02-05 LAB — BASIC METABOLIC PANEL
CO2: 30
Chloride: 103
Creatinine, Ser: 0.57
GFR calc Af Amer: >60

## 2011-02-18 LAB — CBC
HCT: 38.9
Hemoglobin: 13.3
MCHC: 34.2
MCV: 88.9
Platelets: 208
RBC: 4.38
RDW: 12.6
WBC: 5.2

## 2011-02-18 LAB — HCG, SERUM, QUALITATIVE: Preg, Serum: NEGATIVE

## 2011-08-13 ENCOUNTER — Other Ambulatory Visit (HOSPITAL_BASED_OUTPATIENT_CLINIC_OR_DEPARTMENT_OTHER): Payer: Self-pay | Admitting: Obstetrics and Gynecology

## 2011-08-13 DIAGNOSIS — Z1231 Encounter for screening mammogram for malignant neoplasm of breast: Secondary | ICD-10-CM

## 2011-08-26 ENCOUNTER — Ambulatory Visit (HOSPITAL_BASED_OUTPATIENT_CLINIC_OR_DEPARTMENT_OTHER)
Admission: RE | Admit: 2011-08-26 | Discharge: 2011-08-26 | Disposition: A | Discharge status: Home / Self Care | Payer: Medicare Other | Source: Ambulatory Visit | Attending: Obstetrics and Gynecology | Admitting: Obstetrics and Gynecology

## 2011-08-26 DIAGNOSIS — Z1231 Encounter for screening mammogram for malignant neoplasm of breast: Secondary | ICD-10-CM

## 2013-04-08 ENCOUNTER — Other Ambulatory Visit (HOSPITAL_BASED_OUTPATIENT_CLINIC_OR_DEPARTMENT_OTHER): Payer: Self-pay | Admitting: Obstetrics and Gynecology

## 2013-04-08 DIAGNOSIS — Z1231 Encounter for screening mammogram for malignant neoplasm of breast: Secondary | ICD-10-CM

## 2013-04-09 ENCOUNTER — Ambulatory Visit (HOSPITAL_BASED_OUTPATIENT_CLINIC_OR_DEPARTMENT_OTHER): Payer: Medicare Other

## 2013-04-14 ENCOUNTER — Ambulatory Visit (HOSPITAL_BASED_OUTPATIENT_CLINIC_OR_DEPARTMENT_OTHER): Payer: Self-pay

## 2013-04-19 ENCOUNTER — Ambulatory Visit (HOSPITAL_BASED_OUTPATIENT_CLINIC_OR_DEPARTMENT_OTHER)
Admission: RE | Admit: 2013-04-19 | Discharge: 2013-04-19 | Disposition: A | Discharge status: Home / Self Care | Payer: Medicare Other | Source: Ambulatory Visit | Attending: Obstetrics and Gynecology | Admitting: Obstetrics and Gynecology

## 2013-04-19 DIAGNOSIS — Z1231 Encounter for screening mammogram for malignant neoplasm of breast: Secondary | ICD-10-CM | POA: Insufficient documentation

## 2013-04-28 ENCOUNTER — Other Ambulatory Visit: Payer: Self-pay | Admitting: Obstetrics and Gynecology

## 2013-04-28 DIAGNOSIS — R928 Other abnormal and inconclusive findings on diagnostic imaging of breast: Secondary | ICD-10-CM

## 2013-05-10 ENCOUNTER — Ambulatory Visit
Admission: RE | Admit: 2013-05-10 | Discharge: 2013-05-10 | Disposition: A | Discharge status: Home / Self Care | Payer: Medicare Other | Source: Ambulatory Visit | Attending: Obstetrics and Gynecology | Admitting: Obstetrics and Gynecology

## 2013-05-10 DIAGNOSIS — R928 Other abnormal and inconclusive findings on diagnostic imaging of breast: Secondary | ICD-10-CM

## 2015-04-12 ENCOUNTER — Encounter (HOSPITAL_BASED_OUTPATIENT_CLINIC_OR_DEPARTMENT_OTHER): Payer: Self-pay | Admitting: Emergency Medicine

## 2015-04-12 ENCOUNTER — Emergency Department (HOSPITAL_BASED_OUTPATIENT_CLINIC_OR_DEPARTMENT_OTHER)
Admission: EM | Admit: 2015-04-12 | Discharge: 2015-04-12 | Disposition: A | Discharge status: Home / Self Care | Payer: Medicare Other | Attending: Emergency Medicine | Admitting: Emergency Medicine

## 2015-04-12 DIAGNOSIS — M069 Rheumatoid arthritis, unspecified: Secondary | ICD-10-CM | POA: Insufficient documentation

## 2015-04-12 DIAGNOSIS — R51 Headache: Secondary | ICD-10-CM | POA: Diagnosis not present

## 2015-04-12 DIAGNOSIS — H9201 Otalgia, right ear: Secondary | ICD-10-CM | POA: Diagnosis not present

## 2015-04-12 DIAGNOSIS — J020 Streptococcal pharyngitis: Secondary | ICD-10-CM

## 2015-04-12 DIAGNOSIS — J029 Acute pharyngitis, unspecified: Secondary | ICD-10-CM | POA: Diagnosis present

## 2015-04-12 DIAGNOSIS — Z87448 Personal history of other diseases of urinary system: Secondary | ICD-10-CM | POA: Insufficient documentation

## 2015-04-12 DIAGNOSIS — Z79899 Other long term (current) drug therapy: Secondary | ICD-10-CM | POA: Insufficient documentation

## 2015-04-12 DIAGNOSIS — M545 Low back pain: Secondary | ICD-10-CM | POA: Diagnosis not present

## 2015-04-12 DIAGNOSIS — M791 Myalgia: Secondary | ICD-10-CM | POA: Diagnosis not present

## 2015-04-12 HISTORY — DX: Rheumatoid arthritis, unspecified: M06.9

## 2015-04-12 HISTORY — DX: Disorder of kidney and ureter, unspecified: N28.9

## 2015-04-12 LAB — RAPID STREP SCREEN (MED CTR MEBANE ONLY): Streptococcus, Group A Screen (Direct): POSITIVE — AB

## 2015-04-12 MED ORDER — NAPROXEN 500 MG PO TABS: 500.0000 mg | ORAL_TABLET | Freq: Two times a day (BID) | ORAL | Status: DC

## 2015-04-12 MED ORDER — IBUPROFEN 400 MG PO TABS
600.0000 mg | ORAL_TABLET | Freq: Once | ORAL | Status: AC
  Administered 2015-04-12: 600 mg via ORAL
  Filled 2015-04-12: qty 1

## 2015-04-12 MED ORDER — PENICILLIN V POTASSIUM 250 MG PO TABS
500.0000 mg | ORAL_TABLET | Freq: Once | ORAL | Status: AC
  Administered 2015-04-12: 500 mg via ORAL
  Filled 2015-04-12: qty 2

## 2015-04-12 MED ORDER — PENICILLIN V POTASSIUM 500 MG PO TABS: 500.0000 mg | ORAL_TABLET | Freq: Four times a day (QID) | ORAL | Status: DC

## 2015-04-12 NOTE — ED Provider Notes (Signed)
CSN: [id number]     Arrival date & time 04/12/15  1911 History   By signing my name below, I, Arlan Organ, attest that this documentation has been prepared under the direction and in the presence of Vanetta Mulders, MD.  Electronically Signed: Arlan Organ, ED Scribe. 04/12/2015. 10:27 PM.   Chief Complaint  Patient presents with  . Sore Throat   The history is provided by the patient. No language interpreter was used.    HPI Comments: Angelica Butler is a 49 y.o. female without any pertinent past medical history who presents to the Emergency Department complaining of constant, ongoing sore throat x 1 day. She also reports associated cough, lowe grade fever, and R sided otalgia. No aggravating or alleviating factors attempted prior to arrival. OTC Tylenol attempted prior to arrival without any improvement for symptoms. No recent nausea, vomiting, abdominal pain, chest pain, or shortness of breath. Ms. Koerber admits she has tested positive for strep several times in the past. No known allergies to medications.  PCP: Tracey Harries, MD    Past Medical History  Diagnosis Date  . Rheumatoid arthritis (HCC)   . Renal disorder     cysts on kidneys   Past Surgical History  Procedure Laterality Date  . Cesarean section     No family history on file. Social History  Substance Use Topics  . Smoking status: Never Smoker   . Smokeless tobacco: None  . Alcohol Use: No   OB History    No data available     Review of Systems  Constitutional: Positive for fever. Negative for chills.  HENT: Positive for ear pain and sore throat. Negative for rhinorrhea.   Eyes: Negative for visual disturbance.  Respiratory: Positive for cough. Negative for shortness of breath.   Cardiovascular: Negative for chest pain and leg swelling.  Gastrointestinal: Negative for nausea, vomiting, abdominal pain and diarrhea.  Genitourinary: Negative for dysuria.  Musculoskeletal: Positive for myalgias and back pain.  Negative for neck pain.  Skin: Negative for rash.  Neurological: Positive for headaches. Negative for dizziness and light-headedness.  Hematological: Does not bruise/bleed easily.  Psychiatric/Behavioral: Negative for confusion.      Allergies  Review of patient's allergies indicates no known allergies.  Home Medications   Prior to Admission medications   Medication Sig Start Date End Date Taking? Authorizing Provider  ALPRAZolam Prudy Feeler) 1 MG tablet Take 1 mg by mouth at bedtime as needed for anxiety.   Yes Historical Provider, MD  FLUoxetine (PROZAC) 10 MG tablet Take 10 mg by mouth daily.   Yes Historical Provider, MD  lisdexamfetamine (VYVANSE) 70 MG capsule Take 70 mg by mouth daily.   Yes Historical Provider, MD  metoprolol succinate (TOPROL-XL) 100 MG 24 hr tablet Take 100 mg by mouth daily. Take with or immediately following a meal.   Yes Historical Provider, MD  tapentadol (NUCYNTA) 50 MG TABS tablet Take 200 mg by mouth.   Yes Historical Provider, MD  topiramate (TOPAMAX) 100 MG tablet Take 200 mg by mouth 2 (two) times daily.   Yes Historical Provider, MD  Vitamin D, Ergocalciferol, (DRISDOL) 50000 UNITS CAPS capsule Take 50,000 Units by mouth every 7 (seven) days.   Yes Historical Provider, MD  naproxen (NAPROSYN) 500 MG tablet Take 1 tablet (500 mg total) by mouth 2 (two) times daily. 04/12/15   Vanetta Mulders, MD  penicillin v potassium (VEETID) 500 MG tablet Take 1 tablet (500 mg total) by mouth 4 (four) times daily. 04/12/15  Vanetta Mulders, MD   Triage Vitals: BP 126/86 mmHg  Pulse 74  Temp(Src) 98.6 F (37 C) (Oral)  Resp 18  Ht 5\' 2"  (1.575 m)  Wt 74.844 kg  BMI 30.17 kg/m2  SpO2 100%   Physical Exam  Constitutional: She is oriented to person, place, and time. She appears well-developed and well-nourished. No distress.  HENT:  Head: Normocephalic and atraumatic.  Mouth/Throat: Oropharyngeal exudate present.  Uvula midline Exudate to R tonsil   Eyes:  Conjunctivae and EOM are normal. Pupils are equal, round, and reactive to light.  Sclera clear Eyes track normally   Neck: Normal range of motion.  Cardiovascular: Normal rate, regular rhythm and normal heart sounds.   No murmur heard. Pulmonary/Chest: Effort normal and breath sounds normal.  Abdominal: Soft. Bowel sounds are normal. She exhibits no distension. There is no tenderness.  Musculoskeletal: Normal range of motion. She exhibits no edema.  Lymphadenopathy:    She has cervical adenopathy (anterior).  Neurological: She is alert and oriented to person, place, and time.  Skin: Skin is warm and dry.  Psychiatric: She has a normal mood and affect. Judgment normal.  Nursing note and vitals reviewed.   ED Course  Procedures (including critical care time)  DIAGNOSTIC STUDIES: Oxygen Saturation is 100% on RA, Normal by my interpretation.    COORDINATION OF CARE: 10:19 PM- Will order Rapid step screen. Will give Ibuprofen. Discussed treatment plan with pt at bedside and pt agreed to plan.     Labs Review Labs Reviewed  RAPID STREP SCREEN (NOT AT [facility]) - Abnormal; Notable for the following:    Streptococcus, Group A Screen (Direct) POSITIVE (*)    All other components within normal limits    Imaging Review No results found. I have personally reviewed and evaluated these images and lab results as part of my medical decision-making.   EKG Interpretation None      MDM   Final diagnoses:  Strep pharyngitis    Patient rapid strep positive for strep throat. Patient's had a history of strep in the past. No evidence of any peritonsillar abscess at this point in time. Exudate to the right tonsil with enlargement minimal erythema to the left tonsil uvula is midline no significant soft palate swelling at this time. Patient nontoxic no acute distress. Patient be treated with first dose of oral penicillin here tonight and will continue prescription for penicillin as well as  Naprosyn.  I personally performed the services described in this documentation, which was scribed in my presence. The recorded information has been reviewed and is accurate.     [facility], MD 04/12/15 2235

## 2015-04-12 NOTE — ED Notes (Signed)
Pt verbalizes understanding of d/c instructions and denies any further needs at this time. 

## 2015-04-12 NOTE — ED Notes (Signed)
Pt c/o sore throat and cough since yesterday, medicating with tylenol as needed.  Pt has hx of multiple strep throat diagnoses.

## 2015-04-12 NOTE — Discharge Instructions (Signed)
Rapid strep test was positive. Take the antibiotic as directed. Would expect some improvement over the next 2-3 days. Take the Naprosyn as directed. Return for any new or worse symptoms or follow-up with your primary care doctor.

## 2015-04-12 NOTE — ED Notes (Signed)
Sore throat for two days.

## 2015-09-04 ENCOUNTER — Emergency Department (HOSPITAL_BASED_OUTPATIENT_CLINIC_OR_DEPARTMENT_OTHER)
Admission: EM | Admit: 2015-09-04 | Discharge: 2015-09-04 | Disposition: A | Discharge status: Home / Self Care | Payer: Medicare Other | Attending: Emergency Medicine | Admitting: Emergency Medicine

## 2015-09-04 ENCOUNTER — Encounter (HOSPITAL_BASED_OUTPATIENT_CLINIC_OR_DEPARTMENT_OTHER): Payer: Self-pay

## 2015-09-04 ENCOUNTER — Emergency Department (HOSPITAL_BASED_OUTPATIENT_CLINIC_OR_DEPARTMENT_OTHER): Payer: Medicare Other

## 2015-09-04 DIAGNOSIS — S93401A Sprain of unspecified ligament of right ankle, initial encounter: Secondary | ICD-10-CM

## 2015-09-04 DIAGNOSIS — Z79899 Other long term (current) drug therapy: Secondary | ICD-10-CM | POA: Diagnosis not present

## 2015-09-04 DIAGNOSIS — W1839XA Other fall on same level, initial encounter: Secondary | ICD-10-CM | POA: Diagnosis not present

## 2015-09-04 DIAGNOSIS — Y9389 Activity, other specified: Secondary | ICD-10-CM | POA: Insufficient documentation

## 2015-09-04 DIAGNOSIS — S99911A Unspecified injury of right ankle, initial encounter: Secondary | ICD-10-CM | POA: Diagnosis present

## 2015-09-04 DIAGNOSIS — Y990 Civilian activity done for income or pay: Secondary | ICD-10-CM | POA: Insufficient documentation

## 2015-09-04 DIAGNOSIS — M069 Rheumatoid arthritis, unspecified: Secondary | ICD-10-CM | POA: Insufficient documentation

## 2015-09-04 DIAGNOSIS — Y92007 Garden or yard of unspecified non-institutional (private) residence as the place of occurrence of the external cause: Secondary | ICD-10-CM | POA: Insufficient documentation

## 2015-09-04 HISTORY — DX: Other chronic pain: G89.29

## 2015-09-04 HISTORY — DX: Pain in unspecified knee: M25.569

## 2015-09-04 MED ORDER — NAPROXEN 500 MG PO TABS: 500.0000 mg | ORAL_TABLET | Freq: Two times a day (BID) | ORAL | Status: DC

## 2015-09-04 MED ORDER — IBUPROFEN 800 MG PO TABS
800.0000 mg | ORAL_TABLET | Freq: Once | ORAL | Status: AC
  Administered 2015-09-04: 800 mg via ORAL
  Filled 2015-09-04: qty 1

## 2015-09-04 MED ORDER — CYCLOBENZAPRINE HCL 10 MG PO TABS: 10.0000 mg | ORAL_TABLET | Freq: Two times a day (BID) | ORAL | Status: DC | PRN

## 2015-09-04 NOTE — ED Notes (Signed)
Twisted right ankle this am-presents to triage in w/c

## 2015-09-04 NOTE — ED Provider Notes (Signed)
CSN: [id number]     Arrival date & time 09/04/15  1112 History   First MD Initiated Contact with Patient 09/04/15 1119     Chief Complaint  Patient presents with  . Ankle Pain     (Consider location/radiation/quality/duration/timing/severity/associated sxs/prior Treatment) HPI   50 year old female with history of rheumatoid arthritis presenting for evaluation of right ankle injury. Patient reports she was working out in the yard earlier today when she stepped on an even ground, twisted her right ankle and fell down to the ground. She report acute onset of sharp throbbing pain to the affected ankle worsening with movement. She has been unable to ambulate or bear any weight on her ankle. She denies any hip or knee or foot pain. She rates the pain as a 7 out of 10 without movement and a 10 out of 10 with movement. She denies any numbness or weakness. She denies any precipitating symptoms prior to the fall. She did not injure any other body part. She denies any prior injury to same ankle. She is not allergic to any medication.  Past Medical History  Diagnosis Date  . Rheumatoid arthritis (HCC)   . Renal disorder     cysts on kidneys   Past Surgical History  Procedure Laterality Date  . Cesarean section     No family history on file. Social History  Substance Use Topics  . Smoking status: Never Smoker   . Smokeless tobacco: Not on file  . Alcohol Use: No   OB History    No data available     Review of Systems  Constitutional: Negative for fever.  Musculoskeletal: Positive for arthralgias.  Skin: Negative for rash and wound.  Neurological: Negative for numbness.      Allergies  Review of patient's allergies indicates no known allergies.  Home Medications   Prior to Admission medications   Medication Sig Start Date End Date Taking? Authorizing Provider  ALPRAZolam Prudy Feeler) 1 MG tablet Take 1 mg by mouth at bedtime as needed for anxiety.    Historical Provider, MD   FLUoxetine (PROZAC) 10 MG tablet Take 10 mg by mouth daily.    Historical Provider, MD  lisdexamfetamine (VYVANSE) 70 MG capsule Take 70 mg by mouth daily.    Historical Provider, MD  metoprolol succinate (TOPROL-XL) 100 MG 24 hr tablet Take 100 mg by mouth daily. Take with or immediately following a meal.    Historical Provider, MD  naproxen (NAPROSYN) 500 MG tablet Take 1 tablet (500 mg total) by mouth 2 (two) times daily. 04/12/15   Vanetta Mulders, MD  penicillin v potassium (VEETID) 500 MG tablet Take 1 tablet (500 mg total) by mouth 4 (four) times daily. 04/12/15   Vanetta Mulders, MD  tapentadol (NUCYNTA) 50 MG TABS tablet Take 200 mg by mouth.    Historical Provider, MD  topiramate (TOPAMAX) 100 MG tablet Take 200 mg by mouth 2 (two) times daily.    Historical Provider, MD  Vitamin D, Ergocalciferol, (DRISDOL) 50000 UNITS CAPS capsule Take 50,000 Units by mouth every 7 (seven) days.    Historical Provider, MD   BP 126/88 mmHg  Pulse 82  Temp(Src) 98.2 F (36.8 C) (Oral)  Resp 16  Ht 5\' 2"  (1.575 m)  Wt 71.215 kg  BMI 28.71 kg/m2  SpO2 100% Physical Exam  Constitutional: She appears well-developed and well-nourished. No distress.  HENT:  Head: Atraumatic.  Eyes: Conjunctivae are normal.  Neck: Neck supple.  Musculoskeletal:  Right hip: Normal.       Right knee: Normal.       Right ankle: She exhibits swelling. She exhibits normal range of motion, no ecchymosis, no deformity and normal pulse. Tenderness. AITFL tenderness found. No head of 5th metatarsal tenderness found.  Neurological: She is alert.  Skin: No rash noted.  Psychiatric: She has a normal mood and affect.  Nursing note and vitals reviewed.   ED Course  Procedures (including critical care time) Labs Review Labs Reviewed - No data to display  Imaging Review Dg Ankle Complete Right  09/04/2015  CLINICAL DATA:  Fall this morning with ankle pain, initial encounter EXAM: RIGHT ANKLE - COMPLETE 3+ VIEW  COMPARISON:  None. FINDINGS: Mild soft tissue swelling is noted about the tibiotalar articulation anteriorly. A small calcaneal spur is noted. No definitive fracture or dislocation is seen. IMPRESSION: Mild soft tissue swelling without acute bony abnormality. Electronically Signed   By: Alcide Clever M.D.   On: 09/04/2015 12:26   I have personally reviewed and evaluated these images and lab results as part of my medical decision-making.   EKG Interpretation None      MDM   Final diagnoses:  Right ankle sprain, initial encounter    BP 118/87 mmHg  Pulse 72  Temp(Src) 98.2 F (36.8 C) (Oral)  Resp 18  Ht 5\' 2"  (1.575 m)  Wt 71.215 kg  BMI 28.71 kg/m2  SpO2 97%   11:52 AM Patient had a mechanical injury and injured her right ankle. Tenderness is most significant at the anterior-inferior talofibular ligament. This is a closed injury. X-ray shows no acute fractures or dislocation. No pain at fifth metatarsal concerning for avulsion fracture of her right foot. She is neurovascularly intact. ASO provided, pt has a set of crutches that she can use at home. Antiinflammatory and muscle relaxant meds prescribed.  Ortho referral given as needed.  , PA-C 09/04/15 1231  11/04/15, MD 09/04/15 1247

## 2015-09-04 NOTE — Discharge Instructions (Signed)

## 2015-10-18 ENCOUNTER — Other Ambulatory Visit: Payer: Self-pay | Admitting: Obstetrics and Gynecology

## 2015-10-18 DIAGNOSIS — N6311 Unspecified lump in the right breast, upper outer quadrant: Secondary | ICD-10-CM

## 2015-10-23 ENCOUNTER — Ambulatory Visit
Admission: RE | Admit: 2015-10-23 | Discharge: 2015-10-23 | Disposition: A | Discharge status: Home / Self Care | Payer: Medicare Other | Source: Ambulatory Visit | Attending: Obstetrics and Gynecology | Admitting: Obstetrics and Gynecology

## 2015-10-23 DIAGNOSIS — N6311 Unspecified lump in the right breast, upper outer quadrant: Secondary | ICD-10-CM

## 2016-06-24 ENCOUNTER — Encounter (HOSPITAL_BASED_OUTPATIENT_CLINIC_OR_DEPARTMENT_OTHER): Payer: Self-pay | Admitting: *Deleted

## 2016-06-24 ENCOUNTER — Emergency Department (HOSPITAL_BASED_OUTPATIENT_CLINIC_OR_DEPARTMENT_OTHER)
Admission: EM | Admit: 2016-06-24 | Discharge: 2016-06-24 | Disposition: A | Discharge status: Home / Self Care | Payer: Medicare Other | Attending: Emergency Medicine | Admitting: Emergency Medicine

## 2016-06-24 ENCOUNTER — Emergency Department (HOSPITAL_BASED_OUTPATIENT_CLINIC_OR_DEPARTMENT_OTHER): Payer: Medicare Other

## 2016-06-24 DIAGNOSIS — R1084 Generalized abdominal pain: Secondary | ICD-10-CM | POA: Diagnosis not present

## 2016-06-24 DIAGNOSIS — Z79899 Other long term (current) drug therapy: Secondary | ICD-10-CM | POA: Diagnosis not present

## 2016-06-24 DIAGNOSIS — R110 Nausea: Secondary | ICD-10-CM | POA: Diagnosis not present

## 2016-06-24 DIAGNOSIS — R197 Diarrhea, unspecified: Secondary | ICD-10-CM | POA: Diagnosis not present

## 2016-06-24 DIAGNOSIS — R140 Abdominal distension (gaseous): Secondary | ICD-10-CM | POA: Insufficient documentation

## 2016-06-24 LAB — CBC WITH DIFFERENTIAL/PLATELET
Basophils Absolute: 0 10*3/uL (ref 0.0–0.1)
Basophils Relative: 0 %
Eosinophils Absolute: 0.3 10*3/uL (ref 0.0–0.7)
Eosinophils Relative: 2 %
HEMATOCRIT: 44.1 % (ref 36.0–46.0)
HEMOGLOBIN: 15 g/dL (ref 12.0–15.0)
LYMPHS ABS: 1.2 10*3/uL (ref 0.7–4.0)
Lymphocytes Relative: 11 %
MCH: 29.1 pg (ref 26.0–34.0)
MCHC: 34 g/dL (ref 30.0–36.0)
MCV: 85.5 fL (ref 78.0–100.0)
MONOS PCT: 4 %
Monocytes Absolute: 0.4 10*3/uL (ref 0.1–1.0)
NEUTROS ABS: 8.9 10*3/uL — AB (ref 1.7–7.7)
NEUTROS PCT: 83 %
Platelets: 198 10*3/uL (ref 150–400)
RBC: 5.16 MIL/uL — ABNORMAL HIGH (ref 3.87–5.11)
RDW: 13.1 % (ref 11.5–15.5)
WBC: 10.8 10*3/uL — ABNORMAL HIGH (ref 4.0–10.5)

## 2016-06-24 LAB — COMPREHENSIVE METABOLIC PANEL
ALBUMIN: 4.8 g/dL (ref 3.5–5.0)
ALK PHOS: 71 U/L (ref 38–126)
ALT: 12 U/L — AB (ref 14–54)
ANION GAP: 9 (ref 5–15)
AST: 17 U/L (ref 15–41)
Albumin: 4.8 (ref 3.5–5.0)
BUN: 18 mg/dL (ref 6–20)
CO2: 24 mmol/L (ref 22–32)
CREATININE: 1.1 mg/dL — AB (ref 0.44–1.00)
Calcium: 10 (ref 8.7–10.7)
Calcium: 10 mg/dL (ref 8.9–10.3)
Chloride: 106 mmol/L (ref 101–111)
GFR calc non Af Amer: 58 mL/min — ABNORMAL LOW (ref >60)
GLUCOSE: 134 mg/dL — AB (ref 65–99)
Potassium: 2.8 mmol/L — ABNORMAL LOW (ref 3.5–5.1)
SODIUM: 139 mmol/L (ref 135–145)
Total Bilirubin: 0.5 mg/dL (ref 0.3–1.2)
Total Protein: 8 g/dL (ref 6.5–8.1)
eGFR: 58

## 2016-06-24 LAB — URINALYSIS, ROUTINE W REFLEX MICROSCOPIC
BILIRUBIN URINE: NEGATIVE
Glucose, UA: NEGATIVE mg/dL
Hgb urine dipstick: NEGATIVE
Ketones, ur: NEGATIVE mg/dL
NITRITE: NEGATIVE
Protein, ur: NEGATIVE mg/dL
SPECIFIC GRAVITY, URINE: 1.015 (ref 1.005–1.030)
pH: 6 (ref 5.0–8.0)

## 2016-06-24 LAB — URINALYSIS, MICROSCOPIC (REFLEX): RBC / HPF: NONE SEEN RBC/hpf (ref 0–5)

## 2016-06-24 LAB — BASIC METABOLIC PANEL
BUN: 18 (ref 4–21)
CO2: 24 — AB (ref 13–22)
Chloride: 106 (ref 99–108)
Creatinine: 1.1 (ref 0.5–1.1)
Glucose: 134
Potassium: 2.8 mEq/L — AB (ref 3.5–5.1)
Sodium: 139 (ref 137–147)

## 2016-06-24 LAB — C DIFFICILE QUICK SCREEN W PCR REFLEX
C Diff antigen: NEGATIVE
C Diff interpretation: NOT DETECTED
C Diff toxin: NEGATIVE

## 2016-06-24 LAB — HEPATIC FUNCTION PANEL
ALT: 12 U/L (ref 7–35)
AST: 17 (ref 13–35)
Alkaline Phosphatase: 71 (ref 25–125)

## 2016-06-24 LAB — LIPASE, BLOOD: Lipase: 17 U/L (ref 11–51)

## 2016-06-24 LAB — PREGNANCY, URINE: PREG TEST UR: NEGATIVE

## 2016-06-24 MED ORDER — SODIUM CHLORIDE 0.9 % IV BOLUS (SEPSIS)
1000.0000 mL | Freq: Once | INTRAVENOUS | Status: AC
  Administered 2016-06-24: 1000 mL via INTRAVENOUS

## 2016-06-24 MED ORDER — DICYCLOMINE HCL 10 MG/ML IM SOLN
20.0000 mg | Freq: Once | INTRAMUSCULAR | Status: AC
  Administered 2016-06-24: 20 mg via INTRAMUSCULAR
  Filled 2016-06-24: qty 2

## 2016-06-24 MED ORDER — ONDANSETRON HCL 4 MG/2ML IJ SOLN
4.0000 mg | Freq: Once | INTRAMUSCULAR | Status: AC
  Administered 2016-06-24: 4 mg via INTRAVENOUS
  Filled 2016-06-24: qty 2

## 2016-06-24 MED ORDER — METOCLOPRAMIDE HCL 5 MG/ML IJ SOLN
10.0000 mg | Freq: Once | INTRAMUSCULAR | Status: AC
  Administered 2016-06-24: 10 mg via INTRAVENOUS
  Filled 2016-06-24: qty 2

## 2016-06-24 MED ORDER — GI COCKTAIL ~~LOC~~
30.0000 mL | Freq: Once | ORAL | Status: AC
  Administered 2016-06-24: 30 mL via ORAL
  Filled 2016-06-24: qty 30

## 2016-06-24 MED ORDER — PANTOPRAZOLE SODIUM 20 MG PO TBEC: 20.0000 mg | DELAYED_RELEASE_TABLET | Freq: Every day | ORAL | 0 refills | Status: DC

## 2016-06-24 MED ORDER — METOCLOPRAMIDE HCL 10 MG PO TABS: 10.0000 mg | ORAL_TABLET | Freq: Four times a day (QID) | ORAL | 0 refills | Status: DC | PRN

## 2016-06-24 MED ORDER — POTASSIUM CHLORIDE CRYS ER 20 MEQ PO TBCR
40.0000 meq | EXTENDED_RELEASE_TABLET | Freq: Once | ORAL | Status: AC
  Administered 2016-06-24: 40 meq via ORAL
  Filled 2016-06-24: qty 2

## 2016-06-24 MED ORDER — IOPAMIDOL (ISOVUE-300) INJECTION 61%
100.0000 mL | Freq: Once | INTRAVENOUS | Status: AC | PRN
  Administered 2016-06-24: 100 mL via INTRAVENOUS

## 2016-06-24 MED ORDER — FAMOTIDINE IN NACL 20-0.9 MG/50ML-% IV SOLN
20.0000 mg | Freq: Once | INTRAVENOUS | Status: AC
  Administered 2016-06-24: 20 mg via INTRAVENOUS
  Filled 2016-06-24: qty 50

## 2016-06-24 MED ORDER — MORPHINE SULFATE (PF) 4 MG/ML IV SOLN
4.0000 mg | Freq: Once | INTRAVENOUS | Status: AC
  Administered 2016-06-24: 4 mg via INTRAVENOUS
  Filled 2016-06-24: qty 1

## 2016-06-24 MED ORDER — DIPHENOXYLATE-ATROPINE 2.5-0.025 MG PO TABS
1.0000 | ORAL_TABLET | Freq: Once | ORAL | Status: AC
  Administered 2016-06-24: 1 via ORAL
  Filled 2016-06-24: qty 1

## 2016-06-24 MED ORDER — SUCRALFATE 1 G PO TABS: 1.0000 g | ORAL_TABLET | Freq: Three times a day (TID) | ORAL | 0 refills | Status: DC

## 2016-06-24 MED FILL — METOCLOPRAMIDE 10 MG TABLET: 10 | 2 days supply | Qty: 6 | Fill #0

## 2016-06-24 MED FILL — SUCRALFATE 1 GM TABLET: 1 | 8 days supply | Qty: 30 | Fill #0

## 2016-06-24 MED FILL — PANTOPRAZOLE SOD DR 20 MG T: 20 | 20 days supply | Qty: 20 | Fill #0

## 2016-06-24 NOTE — ED Notes (Signed)
Pt states she doesn't feel any better, MD notified

## 2016-06-24 NOTE — ED Notes (Signed)
Patient reports feeling a little better after the GI Cocktail

## 2016-06-24 NOTE — Discharge Instructions (Signed)
YOUR CREATININE (KIDNEY FUNCTION) WAS MILDLY ELEVATED AND YOUR POTASSIUM WAS LOW.  THIS NEEDS TO BE RECHECKED BY YOUR DOCTOR.

## 2016-06-24 NOTE — ED Notes (Signed)
ED Provider at bedside. Hold on d/c

## 2016-06-24 NOTE — ED Provider Notes (Signed)
[facility] Provider Note   CSN: [id number] Arrival date & time: 06/24/16  0814     History   Chief Complaint Chief Complaint  Patient presents with  . Diarrhea    HPI Angelica Butler is a 51 y.o. female.  Patient is a 51 year old female who presents with diarrhea. She states 2 days ago she started having watery diarrhea. It's been ongoing since that time. She's had multiple episodes of diarrhea. She has nausea and dry heaves but no other vomiting. She has a lot of gastroesophageal reflux disease and feels bloated. She has diffuse abdominal cramping. She has a little bit of burning on urination. No known fevers. She states she aches all over. She recently was treated for strep throat with antibiotics about a week ago. She's been using Zofran with no improvement in symptoms.      Past Medical History:  Diagnosis Date  . Knee pain, chronic   . 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 Rheumatoid arthritis [facility])     Patient Active Problem List   Diagnosis Date Noted  . LOM 06/26/2007  . CONSTIPATION 02/02/2007  . CONSTIPATION, OTHER 02/02/2007    Past Surgical History:  Procedure Laterality Date  . CESAREAN SECTION    . ENDOMETRIAL ABLATION    . RADIOFREQUENCY ABLATION NERVES      OB History    No data available       Home Medications    Prior to Admission medications   Medication Sig Start Date End Date Taking? Authorizing Provider  ALPRAZolam Prudy Feeler) 1 MG tablet Take 1 mg by mouth at bedtime as needed for anxiety.    Historical Provider, MD  FLUoxetine (PROZAC) 10 MG tablet Take 10 mg by mouth daily.    Historical Provider, MD  lisdexamfetamine (VYVANSE) 70 MG capsule Take 70 mg by mouth daily.    Historical Provider, MD  metoCLOPramide (REGLAN) 10 MG tablet Take 1 tablet (10 mg total) by mouth every 6 (six) hours as needed for nausea (nausea/headache). 06/24/16   Rolan Bucco, MD  metoprolol succinate (TOPROL-XL) 100 MG 24 hr tablet Take 100 mg by  mouth daily. Take with or immediately following a meal.    Historical Provider, MD  oxyCODONE-acetaminophen (PERCOCET) 10-325 MG tablet Take 1 tablet by mouth every 4 (four) hours as needed for pain.    Historical Provider, MD  pantoprazole (PROTONIX) 20 MG tablet Take 1 tablet (20 mg total) by mouth daily. 06/24/16   Rolan Bucco, MD  sucralfate (CARAFATE) 1 g tablet Take 1 tablet (1 g total) by mouth 4 (four) times daily -  with meals and at bedtime. 06/24/16   Rolan Bucco, MD  tapentadol (NUCYNTA) 50 MG TABS tablet Take 200 mg by mouth.    Historical Provider, MD  topiramate (TOPAMAX) 100 MG tablet Take 200 mg by mouth 2 (two) times daily.    Historical Provider, MD  Vitamin D, Ergocalciferol, (DRISDOL) 50000 UNITS CAPS capsule Take 50,000 Units by mouth every 7 (seven) days.    Historical Provider, MD    Family History History reviewed. No pertinent family history.  Social History Social History  Substance Use Topics  . Smoking status: Never Smoker  . Smokeless tobacco: Not on file  . Alcohol use No     Allergies   Patient has no known allergies.   Review of Systems Review of Systems  Constitutional: Negative for chills, diaphoresis, fatigue and fever.  HENT: Negative for congestion, rhinorrhea and sneezing.  Eyes: Negative.   Respiratory: Negative for cough, chest tightness and shortness of breath.   Cardiovascular: Negative for chest pain and leg swelling.  Gastrointestinal: Positive for abdominal distention, abdominal pain, diarrhea and nausea. Negative for blood in stool and vomiting.  Genitourinary: Negative for difficulty urinating, flank pain, frequency and hematuria.  Musculoskeletal: Negative for arthralgias and back pain.  Skin: Negative for rash.  Neurological: Negative for dizziness, speech difficulty, weakness, numbness and headaches.     Physical Exam Updated Vital Signs BP 100/66 (BP Location: Right Arm)   Pulse 84   Temp 98.4 F (36.9 C) (Oral)    Resp 18   Ht 5\' 2"  (1.575 m)   Wt 162 lb (73.5 kg)   SpO2 95%   BMI 29.63 kg/m   Physical Exam  Constitutional: She is oriented to person, place, and time. She appears well-developed and well-nourished.  HENT:  Head: Normocephalic and atraumatic.  Eyes: Pupils are equal, round, and reactive to light.  Neck: Normal range of motion. Neck supple.  Cardiovascular: Normal rate, regular rhythm and normal heart sounds.   Pulmonary/Chest: Effort normal and breath sounds normal. No respiratory distress. She has no wheezes. She has no rales. She exhibits no tenderness.  Abdominal: Soft. Bowel sounds are normal. There is tenderness (Mild diffuse tenderness). There is no rebound and no guarding.  Musculoskeletal: Normal range of motion. She exhibits no edema.  Lymphadenopathy:    She has no cervical adenopathy.  Neurological: She is alert and oriented to person, place, and time.  Skin: Skin is warm and dry. No rash noted.  Psychiatric: She has a normal mood and affect.     ED Treatments / Results  Labs (all labs ordered are listed, but only abnormal results are displayed) Labs Reviewed  COMPREHENSIVE METABOLIC PANEL - Abnormal; Notable for the following:       Result Value   Potassium 2.8 (*)    Glucose, Bld 134 (*)    Creatinine, Ser 1.10 (*)    ALT 12 (*)    GFR calc non Af Amer 58 (*)    All other components within normal limits  CBC WITH DIFFERENTIAL/PLATELET - Abnormal; Notable for the following:    WBC 10.8 (*)    RBC 5.16 (*)    Neutro Abs 8.9 (*)    All other components within normal limits  URINALYSIS, ROUTINE W REFLEX MICROSCOPIC - Abnormal; Notable for the following:    Leukocytes, UA TRACE (*)    All other components within normal limits  URINALYSIS, MICROSCOPIC (REFLEX) - Abnormal; Notable for the following:    Bacteria, UA MANY (*)    Squamous Epithelial / LPF 0-5 (*)    All other components within normal limits  C DIFFICILE QUICK SCREEN W PCR REFLEX  URINE  CULTURE  LIPASE, BLOOD  PREGNANCY, URINE    EKG  EKG Interpretation None       Radiology Ct Abdomen Pelvis W Contrast  Result Date: 06/24/2016 CLINICAL DATA:  Diarrhea and belching over the last 3 days, worsening. EXAM: CT ABDOMEN AND PELVIS WITH CONTRAST TECHNIQUE: Multidetector CT imaging of the abdomen and pelvis was performed using the standard protocol following bolus administration of intravenous contrast. CONTRAST:  ISOVUE-300 IOPAMIDOL (ISOVUE-300) INJECTION 61% COMPARISON:  03/15/2015 FINDINGS: Lower chest: Normal Hepatobiliary: Liver normal.  No calcified gallstones. Pancreas: Normal.  Relative fatty change. Spleen: Normal Adrenals/Urinary Tract: Adrenal glands are normal. Left kidney is normal. Right kidney shows no change in a 2.6 by 1.8 cm  cyst the lower pole. Tiny nonobstructing stone lower pole as seen previously. Stomach/Bowel: Normal. No evidence of bowel obstruction, inflammation or focal lesion. Vascular/Lymphatic: Normal Reproductive: Previous hysterectomy.  No pelvic mass. Other: No free fluid or air. Musculoskeletal: Ordinary mild spinal degenerative changes. IMPRESSION: No acute or significant finding. No cause of diarrhea and belching identified. No visible bowel pathology. Electronically Signed   By: Paulina Fusi M.D.   On: 06/24/2016 13:42    Procedures Procedures (including critical care time)  Medications Ordered in ED Medications  ondansetron (ZOFRAN) injection 4 mg (4 mg Intravenous Given 06/24/16 1056)  sodium chloride 0.9 % bolus 1,000 mL (0 mLs Intravenous Stopped 06/24/16 1205)  famotidine (PEPCID) IVPB 20 mg premix (0 mg Intravenous Stopped 06/24/16 1125)  dicyclomine (BENTYL) injection 20 mg (20 mg Intramuscular Given 06/24/16 1058)  diphenoxylate-atropine (LOMOTIL) 2.5-0.025 MG per tablet 1 tablet (1 tablet Oral Given 06/24/16 1054)  sodium chloride 0.9 % bolus 1,000 mL (0 mLs Intravenous Stopped 06/24/16 1305)  potassium chloride SA (K-DUR,KLOR-CON)  CR tablet 40 mEq (40 mEq Oral Given 06/24/16 1206)  morphine 4 MG/ML injection 4 mg (4 mg Intravenous Given 06/24/16 1305)  metoCLOPramide (REGLAN) injection 10 mg (10 mg Intravenous Given 06/24/16 1305)  iopamidol (ISOVUE-300) 61 % injection 100 mL (100 mLs Intravenous Contrast Given 06/24/16 1325)  sodium chloride 0.9 % bolus 1,000 mL (0 mLs Intravenous Stopped 06/24/16 1503)  gi cocktail (Maalox,Lidocaine,Donnatal) (30 mLs Oral Given 06/24/16 1523)     Initial Impression / Assessment and Plan / ED Course  I have reviewed the triage vital signs and the nursing notes.  Pertinent labs & imaging results that were available during my care of the patient were reviewed by me and considered in my medical decision making (see chart for details).  Clinical Course as of Jun 24 1530  Mon Jun 24, 2016  1300 Pt not feeling much better after meds.  Still complaining of abd bloating, pain, nausea.  Will check CT  [MB]    Clinical Course User Index [MB] Rolan Bucco, MD    Patient presents with diarrhea and nausea. She has a mild elevation in her creatinine and hypokalemia. Her potassium was replaced in the ED. She was given IV fluids. She had no vomiting in the ED. She was given medications for nausea and cramping. She still had ongoing abdominal pain and a CT scan was performed which is negative for acute abnormality. She is at this point tolerating oral fluids. Still has a lot of burning in her throat consistent with gastroesophageal reflux disease. She was given a GI cocktail with some improvement. She was discharged home in good condition. She was given prescriptions for Reglan, Carafate and Protonix. She was encouraged to have follow-up with her PCP. I did advise her that she'll need to have her creatinine and potassium recheck. Return precautions were given.  Final Clinical Impressions(s) / ED Diagnoses   Final diagnoses:  Diarrhea, unspecified type    New Prescriptions New Prescriptions    METOCLOPRAMIDE (REGLAN) 10 MG TABLET    Take 1 tablet (10 mg total) by mouth every 6 (six) hours as needed for nausea (nausea/headache).   PANTOPRAZOLE (PROTONIX) 20 MG TABLET    Take 1 tablet (20 mg total) by mouth daily.   SUCRALFATE (CARAFATE) 1 G TABLET    Take 1 tablet (1 g total) by mouth 4 (four) times daily -  with meals and at bedtime.     Rolan Bucco, MD 06/24/16 [phone]

## 2016-06-24 NOTE — ED Notes (Signed)
Pt states she feels the same, no diarrhea since being in ED, no n/v

## 2016-06-24 NOTE — ED Triage Notes (Signed)
Diarrhea and belching x several days.  zofran PTA.  Reports abdominal cramping.

## 2016-06-25 LAB — URINE CULTURE

## 2017-01-02 ENCOUNTER — Other Ambulatory Visit: Payer: Self-pay | Admitting: Obstetrics and Gynecology

## 2017-01-02 DIAGNOSIS — Z1239 Encounter for other screening for malignant neoplasm of breast: Secondary | ICD-10-CM

## 2017-01-07 ENCOUNTER — Ambulatory Visit
Admission: RE | Admit: 2017-01-07 | Discharge: 2017-01-07 | Disposition: A | Discharge status: Home / Self Care | Payer: Medicare Other | Source: Ambulatory Visit | Attending: Obstetrics and Gynecology | Admitting: Obstetrics and Gynecology

## 2017-01-07 DIAGNOSIS — Z1239 Encounter for other screening for malignant neoplasm of breast: Secondary | ICD-10-CM

## 2017-01-10 ENCOUNTER — Encounter: Payer: Self-pay | Admitting: Neurology

## 2017-02-02 ENCOUNTER — Encounter (HOSPITAL_BASED_OUTPATIENT_CLINIC_OR_DEPARTMENT_OTHER): Payer: Self-pay | Admitting: Emergency Medicine

## 2017-02-02 ENCOUNTER — Emergency Department (HOSPITAL_BASED_OUTPATIENT_CLINIC_OR_DEPARTMENT_OTHER): Payer: No Typology Code available for payment source

## 2017-02-02 ENCOUNTER — Emergency Department (HOSPITAL_BASED_OUTPATIENT_CLINIC_OR_DEPARTMENT_OTHER)
Admission: EM | Admit: 2017-02-02 | Discharge: 2017-02-03 | Disposition: A | Discharge status: Home / Self Care | Payer: No Typology Code available for payment source | Attending: Emergency Medicine | Admitting: Emergency Medicine

## 2017-02-02 DIAGNOSIS — Z79899 Other long term (current) drug therapy: Secondary | ICD-10-CM | POA: Diagnosis not present

## 2017-02-02 DIAGNOSIS — R51 Headache: Secondary | ICD-10-CM | POA: Insufficient documentation

## 2017-02-02 DIAGNOSIS — M2550 Pain in unspecified joint: Secondary | ICD-10-CM | POA: Insufficient documentation

## 2017-02-02 DIAGNOSIS — H02401 Unspecified ptosis of right eyelid: Secondary | ICD-10-CM

## 2017-02-02 DIAGNOSIS — M542 Cervicalgia: Secondary | ICD-10-CM | POA: Diagnosis not present

## 2017-02-02 DIAGNOSIS — H539 Unspecified visual disturbance: Secondary | ICD-10-CM | POA: Diagnosis not present

## 2017-02-02 LAB — PREGNANCY, URINE: PREG TEST UR: NEGATIVE
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
## 2017-02-02 NOTE — ED Notes (Signed)
Patient transported to CT 

## 2017-02-02 NOTE — ED Notes (Signed)
Pt given cup to attempt urine sample. 

## 2017-02-03 ENCOUNTER — Emergency Department (HOSPITAL_BASED_OUTPATIENT_CLINIC_OR_DEPARTMENT_OTHER): Payer: No Typology Code available for payment source

## 2017-02-03 DIAGNOSIS — R51 Headache: Secondary | ICD-10-CM | POA: Diagnosis not present

## 2017-02-03 LAB — CBC WITH DIFFERENTIAL/PLATELET
Basophils Absolute: 0 10*3/uL (ref 0.0–0.1)
Basophils Relative: 0 %
Eosinophils Absolute: 0.2 10*3/uL (ref 0.0–0.7)
Eosinophils Relative: 3 %
HEMATOCRIT: 37 % (ref 36.0–46.0)
Hemoglobin: 12.2 g/dL (ref 12.0–15.0)
LYMPHS ABS: 2.6 10*3/uL (ref 0.7–4.0)
LYMPHS PCT: 41 %
MCH: 29.5 pg (ref 26.0–34.0)
MCHC: 33 g/dL (ref 30.0–36.0)
MCV: 89.4 fL (ref 78.0–100.0)
MONO ABS: 0.3 10*3/uL (ref 0.1–1.0)
MONOS PCT: 5 %
NEUTROS ABS: 3.2 10*3/uL (ref 1.7–7.7)
Neutrophils Relative %: 51 %
Platelets: 170 10*3/uL (ref 150–400)
RBC: 4.14 MIL/uL (ref 3.87–5.11)
RDW: 12.9 % (ref 11.5–15.5)
WBC: 6.3 10*3/uL (ref 4.0–10.5)

## 2017-02-03 LAB — BASIC METABOLIC PANEL
Anion gap: 5 (ref 5–15)
BUN: 16 mg/dL (ref 6–20)
CALCIUM: 9 mg/dL (ref 8.9–10.3)
CO2: 24 mmol/L (ref 22–32)
CREATININE: 0.84 mg/dL (ref 0.44–1.00)
Chloride: 108 mmol/L (ref 101–111)
Glucose, Bld: 93 mg/dL (ref 65–99)
Potassium: 3 mmol/L — ABNORMAL LOW (ref 3.5–5.1)
Sodium: 137 mmol/L (ref 135–145)

## 2017-02-03 MED ORDER — KETOROLAC TROMETHAMINE 60 MG/2ML IM SOLN
30.0000 mg | Freq: Once | INTRAMUSCULAR | Status: AC
  Administered 2017-02-03: 30 mg via INTRAMUSCULAR
  Filled 2017-02-03: qty 2

## 2017-02-03 MED ORDER — POTASSIUM CHLORIDE CRYS ER 20 MEQ PO TBCR
40.0000 meq | EXTENDED_RELEASE_TABLET | Freq: Once | ORAL | Status: AC
  Administered 2017-02-03: 40 meq via ORAL
  Filled 2017-02-03: qty 2

## 2017-02-03 MED ORDER — IOPAMIDOL (ISOVUE-370) INJECTION 76%
100.0000 mL | Freq: Once | INTRAVENOUS | Status: AC | PRN
  Administered 2017-02-03: 100 mL via INTRAVENOUS
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
## 2017-02-03 NOTE — ED Notes (Signed)
Pt able to open both eyes.

## 2017-02-03 NOTE — ED Notes (Signed)
Pt walking to room texting on cell phone. NAD.

## 2017-02-03 NOTE — ED Notes (Signed)
ED Provider at bedside. 

## 2017-02-26 ENCOUNTER — Ambulatory Visit (INDEPENDENT_AMBULATORY_CARE_PROVIDER_SITE_OTHER): Payer: Medicare Other | Admitting: Neurology

## 2017-02-26 ENCOUNTER — Encounter: Payer: Self-pay | Admitting: Neurology

## 2017-02-26 VITALS — BP 152/92 | HR 94 | Ht 62.0 in | Wt 166.4 lb

## 2017-02-26 DIAGNOSIS — G43009 Migraine without aura, not intractable, without status migrainosus: Secondary | ICD-10-CM | POA: Diagnosis not present

## 2017-02-26 DIAGNOSIS — R251 Tremor, unspecified: Secondary | ICD-10-CM

## 2017-02-26 DIAGNOSIS — G935 Compression of brain: Secondary | ICD-10-CM

## 2017-02-26 DIAGNOSIS — R030 Elevated blood-pressure reading, without diagnosis of hypertension: Secondary | ICD-10-CM

## 2017-02-26 MED ORDER — TOPIRAMATE ER 200 MG PO CAP24: 200.0000 mg | ORAL_CAPSULE | Freq: Every day | ORAL | 0 refills | Status: AC

## 2017-02-26 MED ORDER — SUMATRIPTAN SUCCINATE 3 MG/0.5ML ~~LOC~~ SOAJ: 3.0000 mg | SUBCUTANEOUS | 0 refills | Status: AC

## 2017-02-26 NOTE — Progress Notes (Signed)
NEUROLOGY CONSULTATION NOTE  Angelica Butler MRN: [id number] DOB: [birth year]  Referring provider: Dr. Everlene Other Primary care provider: Dr. Everlene Other  Reason for consult:  Tremor, Arnold-Chiari  HISTORY OF PRESENT ILLNESS: Angelica Butler is a 51 year old right-handed female with chronic pain, cervicalgia, migraines, hypertension, hyperlipidemia and Arnold-Chiari malformation who presents for tremor and Arnold-Chiari Malformation.  History supplemented by PCP note.   MRI reports from 2013 reviewed.  MRI cervical and thoracic from 2008 personally reviewed.  Angelica Butler has chronic pain syndrome involving her head, neck, left thoracic region, and both knees.  She is treated at the [facility] in [place].  In addition to chronic neck and shoulder pain, she has bilateral upper extremity numbness and tingling.  She has had migraines all of her life but they increased in 2006 while pregnant with her second child.  She has had multiple MRIs over the years, which have demonstrated a Chiari I malformation.  She was evaluated by neurosurgery who did not feel the findings were significant or warranted surgical intervention.  She denies diplopia, dysphagia or facial numbness.  Her migraines begin in the back of her head and radiate to the right frontal region.  It is a severe ice-pick pain, associated with nausea and photophobia.  They last all day and occur about 3 days a month.  She is unaware of any triggers.  Toradol shots help.  She takes a Voltaren for abortive therapy.  She takes topiramate 200mg  at bedtime (higher doses caused memory problems).  Past medications/therapies include Botox, biofeedback, metoprolol.  For the past 2 years she reports tremors that have gradually progressed.  They occur in both hands, left worse than right.  It is noticeable when she is using her hands and it is embarrassing to her and her children.  Her paternal grandmother had tremor.  She does have history of  anxiety.  She reports short term memory problems and ability to perform tasks such as math.  MRI cervical and thoracic (05/06/06):  unremarkable.  No syrinx. MRI Brain wo (09/25/11):  minimal cerebellar tonsillar ectopia MRI Cervical wo (09/25/11):  minimal mid cervical spondylitic change with no findings of spinal stenosis.  Spinal cord with normal signal and contour. NCV-EMG of all 4 extremities (11/16/14):  normal  PAST MEDICAL HISTORY: Past Medical History:  Diagnosis Date  . Knee pain, chronic   . 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 Rheumatoid arthritis (HCC)     PAST SURGICAL HISTORY: Past Surgical History:  Procedure Laterality Date  . CESAREAN SECTION    . ENDOMETRIAL ABLATION    . RADIOFREQUENCY ABLATION NERVES      MEDICATIONS: Current Outpatient Prescriptions on File Prior to Visit  Medication Sig Dispense Refill  . ALPRAZolam (XANAX) 1 MG tablet Take 1 mg by mouth at bedtime as needed for anxiety.    11/18/14 FLUoxetine (PROZAC) 10 MG tablet Take 10 mg by mouth daily.    Marland Kitchen lisdexamfetamine (VYVANSE) 70 MG capsule Take 70 mg by mouth daily.    . metoCLOPramide (REGLAN) 10 MG tablet Take 1 tablet (10 mg total) by mouth every 6 (six) hours as needed for nausea (nausea/headache). 6 tablet 0  . metoprolol succinate (TOPROL-XL) 100 MG 24 hr tablet Take 100 mg by mouth daily. Take with or immediately following a meal.    . oxyCODONE-acetaminophen (PERCOCET) 10-325 MG tablet Take 1 tablet by mouth every 4 (four) hours as needed for pain.    . pantoprazole (  PROTONIX) 20 MG tablet Take 1 tablet (20 mg total) by mouth daily. 20 tablet 0  . sucralfate (CARAFATE) 1 g tablet Take 1 tablet (1 g total) by mouth 4 (four) times daily -  with meals and at bedtime. 30 tablet 0  . tapentadol (NUCYNTA) 50 MG TABS tablet Take 200 mg by mouth.    . topiramate (TOPAMAX) 100 MG tablet Take 200 mg by mouth 2 (two) times daily.    . Vitamin D, Ergocalciferol, (DRISDOL) 50000 UNITS CAPS capsule Take 50,000  Units by mouth every 7 (seven) days.     No current facility-administered medications on file prior to visit.     ALLERGIES: No Known Allergies  FAMILY HISTORY: Family History  Problem Relation Age of Onset  . Breast cancer Maternal Aunt   . Breast cancer Paternal Grandmother     SOCIAL HISTORY: Social History   Social History  . Marital status: Married    Spouse name: N/A  . Number of children: N/A  . Years of education: N/A   Occupational History  . Not on file.   Social History Main Topics  . Smoking status: Never Smoker  . Smokeless tobacco: Never Used  . Alcohol use No  . Drug use: No  . Sexual activity: Yes    Birth control/ protection: Post-menopausal   Other Topics Concern  . Not on file   Social History Narrative  . No narrative on file    REVIEW OF SYSTEMS: Constitutional: No fevers, chills, or sweats, no generalized fatigue, change in appetite Eyes: No visual changes, double vision, eye pain Ear, nose and throat: No hearing loss, ear pain, nasal congestion, sore throat Cardiovascular: No chest pain, palpitations Respiratory:  No shortness of breath at rest or with exertion, wheezes GastrointestinaI: No nausea, vomiting, diarrhea, abdominal pain, fecal incontinence Genitourinary:  No dysuria, urinary retention or frequency Musculoskeletal:  No neck pain, back pain Integumentary: No rash, pruritus, skin lesions Neurological: as above Psychiatric: No depression, insomnia, anxiety Endocrine: No palpitations, fatigue, diaphoresis, mood swings, change in appetite, change in weight, increased thirst Hematologic/Lymphatic:  No purpura, petechiae. Allergic/Immunologic: no itchy/runny eyes, nasal congestion, recent allergic reactions, rashes  PHYSICAL EXAM: Vitals:   02/26/17 1320  BP: (!) 152/92  Pulse: 94  SpO2: 98%   General: No acute distress.  Patient appears well-groomed.  Head:  Normocephalic/atraumatic Eyes:  fundi examined but not  visualized Neck: supple, no paraspinal tenderness, full range of motion Back: No paraspinal tenderness Heart: regular rate and rhythm Lungs: Clear to auscultation bilaterally. Vascular: No carotid bruits. Neurological Exam: Mental status: alert and oriented to person, place, and time, recent and remote memory intact, fund of knowledge intact, attention and concentration intact, speech fluent and not dysarthric, language intact. Cranial nerves: CN I: not tested CN II: pupils equal, round and reactive to light, visual fields intact CN III, IV, VI:  full range of motion, no nystagmus, no ptosis CN V: facial sensation intact CN VII: upper and lower face symmetric CN VIII: hearing intact CN IX, X: gag intact, uvula midline CN XI: sternocleidomastoid and trapezius muscles intact CN XII: tongue midline Bulk & Tone: normal, no fasciculations. Motor:  5/5 throughout.  Inconsistent fine tremor in both hands (intention and postural). Sensation: temperature and vibration sensation intact. Deep Tendon Reflexes:  2+ throughout, toes downgoing.  Finger to nose testing:  Without dysmetria.  Heel to shin:  Without dysmetria.  Gait:  Normal station and stride.  Able to turn and tandem walk. Romberg  negative.  IMPRESSION: 1.  Migraine without aura 2.  Arnold-Chiari malformation.  Imaging not available to me but report states it is minimal (although it does not mention how low the cerebellar tonsils descend below the foramen magnum).  I don't it is contributing to any of her symptoms. 3.  Tremor.  At this point, I suspect it to be anxiety-related rather than a physiologic tremor.  4.  Elevated blood pressure.   PLAN: 1.  We will switch topiramate 200mg  at bedtime to extended release topiramate (Trokendi XR) 200mg  at bedtime, which less likely causes memory problems. 2.  At earliest onset of migraine, use the sumatriptan shot (Zembrace Symtouch).  You may repeat dose once after 1 hour if needed.   Contact me if it works/not works and I can write you a prescription or change to something else. 3.  At this time, I feel that the tremor is related to anxiety.  There are medications we can try but I feel the side effects may outweigh the benefits.  We can discuss again at your follow up 4.  Follow up with PCP regarding blood pressure. 5.  Follow up in 3 months.  Thank you for allowing me to take part in the care of this patient.  , DO  CC:  , MD

## 2017-02-26 NOTE — Patient Instructions (Addendum)
1.  We will switch topiramate 200mg  at bedtime to extended release topiramate (Trokendi XR) 200mg  at bedtime, which less likely causes memory problems. 2.  At earliest onset of migraine, use the sumatriptan shot (Zembrace Symtouch).  You may repeat dose once after 1 hour if needed.  Contact me if it works/not works and I can write you a prescription or change to something else. 3.  At this time, I feel that the tremor is related to anxiety.  There are medications we can try but I feel the side effects may outweigh the benefits.  We can discuss again at your follow up 4.  Follow up in 3 months.

## 2017-06-16 ENCOUNTER — Ambulatory Visit: Payer: Medicare Other | Admitting: Neurology

## 2017-06-16 ENCOUNTER — Encounter: Payer: Self-pay | Admitting: Neurology

## 2017-06-16 VITALS — BP 112/90 | HR 94 | Ht 62.0 in | Wt 172.2 lb

## 2017-06-16 DIAGNOSIS — G43009 Migraine without aura, not intractable, without status migrainosus: Secondary | ICD-10-CM

## 2017-06-16 NOTE — Progress Notes (Signed)
NEUROLOGY FOLLOW UP OFFICE NOTE  Angelica Butler [id number]  HISTORY OF PRESENT ILLNESS: Angelica Butler is a 52 year old right-handed female with chronic pain, cervicalgia, migraines, hypertension, hyperlipidemia and Arnold-Chiari malformation who follows up for migraine and Arnold-Chiari Malformation.     UPDATE: Due to cognitive changes, topiramate IR was switched to ER.  However, she was unable to afford it. Intensity: moderate Duration:  1 day Frequency:  3 days a month Frequency of abortive medication: 3 days a month Current NSAIDS:  No Current analgesics:  Excedrin Migraine Current triptans:  Zembrace Symtouch (has not used because migraines not severe now) Current anti-emetic:  Zofran Current muscle relaxants:  no Current anti-anxiolytic:  no Current sleep aide:  no Current Antihypertensive medications:  no Current Antidepressant medications:  Prozac Current Anticonvulsant medications:  topiramate IR 200mg  at bedtime Current Vitamins/Herbal/Supplements:  no Current Antihistamines/Decongestants:  no Other therapy:  No  Depression:  Yes.  Anxiety:  No  HISTORY: Angelica Butler has chronic pain syndrome involving her head, neck, left thoracic region, and both knees.  She is treated at the [facility] in [place].  In addition to chronic neck and shoulder pain, she has bilateral upper extremity numbness and tingling.  She has had migraines all of her life but they increased in 2006 while pregnant with her second child.  She has had multiple MRIs over the years, which have demonstrated a Chiari I malformation.  She was evaluated by neurosurgery who did not feel the findings were significant or warranted surgical intervention.  She denies diplopia, dysphagia or facial numbness.   Her migraines begin in the back of her head and radiate to the right frontal region.  It is a severe ice-pick pain, associated with nausea and photophobia.  Sometimes they are associated with  right sided facial swelling and twisting, eye closes up.  They last all day and occur about 3 days a month.  She is unaware of any triggers.  Toradol shots help.  She takes a Voltaren for abortive therapy.    Past abortive medications:  Sumatriptan tablet, Reglan Past anticonvulsant:  topiramate IR 200mg  at bedtime (higher doses caused memory problems) Past antihypertensive:  metoprolol Other past therapy:  Botox, biofeedback.    Tremors:  For the past 2 years she reports tremors that have gradually progressed.  They occur in both hands, left worse than right.  It is noticeable when she is using her hands and it is embarrassing to her and her children.  Her paternal grandmother had tremor. My suspicion is that they are psychogenic.   She does have history of anxiety.  She reports short term memory problems and ability to perform tasks such as math.   MRI cervical and thoracic (05/06/06):  unremarkable.  No syrinx. MRI Brain wo (09/25/11):  minimal cerebellar tonsillar ectopia MRI Cervical wo (09/25/11):  minimal mid cervical spondylitic change with no findings of spinal stenosis.  Spinal cord with normal signal and contour. NCV-EMG of all 4 extremities (11/16/14):  normal  PAST MEDICAL HISTORY: Past Medical History:  Diagnosis Date  . Knee pain, chronic   . 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 Rheumatoid arthritis (HCC)     MEDICATIONS: Current Outpatient Medications on File Prior to Visit  Medication Sig Dispense Refill  . ALPRAZolam (XANAX) 1 MG tablet Take 1 mg by mouth at bedtime as needed for anxiety.    . Brexpiprazole 2 MG TABS Take 2 mg by mouth daily  at 12 noon.    Marland Kitchen FLUoxetine (PROZAC) 10 MG tablet Take 10 mg by mouth daily.    Marland Kitchen lisdexamfetamine (VYVANSE) 70 MG capsule Take 70 mg by mouth daily.    . metoCLOPramide (REGLAN) 10 MG tablet Take 1 tablet (10 mg total) by mouth every 6 (six) hours as needed for nausea (nausea/headache). 6 tablet 0  . metoprolol succinate  (TOPROL-XL) 100 MG 24 hr tablet Take 100 mg by mouth daily. Take with or immediately following a meal.    . oxyCODONE-acetaminophen (PERCOCET) 10-325 MG tablet Take 1 tablet by mouth every 4 (four) hours as needed for pain.    . pantoprazole (PROTONIX) 20 MG tablet Take 1 tablet (20 mg total) by mouth daily. 20 tablet 0  . sucralfate (CARAFATE) 1 g tablet Take 1 tablet (1 g total) by mouth 4 (four) times daily -  with meals and at bedtime. 30 tablet 0  . SUMAtriptan Succinate (ZEMBRACE SYMTOUCH) 3 MG/0.5ML SOAJ Inject 3 mg into the skin as directed. 2 pen 0  . tapentadol (NUCYNTA) 50 MG TABS tablet Take 200 mg by mouth.    . topiramate (TOPAMAX) 100 MG tablet Take 200 mg by mouth 2 (two) times daily.    . Topiramate ER (TROKENDI XR) 200 MG CP24 Take 200 mg by mouth at bedtime. 14 capsule 0  . Vitamin D, Ergocalciferol, (DRISDOL) 50000 UNITS CAPS capsule Take 50,000 Units by mouth every 7 (seven) days.     No current facility-administered medications on file prior to visit.     ALLERGIES: No Known Allergies  FAMILY HISTORY: Family History  Problem Relation Age of Onset  . Breast cancer Maternal Aunt   . Breast cancer Paternal Grandmother     SOCIAL HISTORY: Social History   Socioeconomic History  . Marital status: Married    Spouse name: Not on file  . Number of children: Not on file  . Years of education: Not on file  . Highest education level: Not on file  Social Needs  . Financial resource strain: Not on file  . Food insecurity - worry: Not on file  . Food insecurity - inability: Not on file  . Transportation needs - medical: Not on file  . Transportation needs - non-medical: Not on file  Occupational History  . Not on file  Tobacco Use  . Smoking status: Never Smoker  . Smokeless tobacco: Never Used  Substance and Sexual Activity  . Alcohol use: No  . Drug use: No  . Sexual activity: Yes    Birth control/protection: Post-menopausal  Other Topics Concern  . Not on  file  Social History Narrative  . Not on file    REVIEW OF SYSTEMS: Constitutional: No fevers, chills, or sweats, no generalized fatigue, change in appetite Eyes: No visual changes, double vision, eye pain Ear, nose and throat: No hearing loss, ear pain, nasal congestion, sore throat Cardiovascular: No chest pain, palpitations Respiratory:  No shortness of breath at rest or with exertion, wheezes GastrointestinaI: No nausea, vomiting, diarrhea, abdominal pain, fecal incontinence Genitourinary:  No dysuria, urinary retention or frequency Musculoskeletal:  No neck pain, back pain Integumentary: No rash, pruritus, skin lesions Neurological: as above Psychiatric: No depression, insomnia, anxiety Endocrine: No palpitations, fatigue, diaphoresis, mood swings, change in appetite, change in weight, increased thirst Hematologic/Lymphatic:  No purpura, petechiae. Allergic/Immunologic: no itchy/runny eyes, nasal congestion, recent allergic reactions, rashes  PHYSICAL EXAM: Vitals:   06/16/17 1435  BP: 112/90  Pulse: 94  SpO2: 98%  General: No acute distress.  Patient appears well-groomed.   Head:  Normocephalic/atraumatic Eyes:  Fundi examined but not visualized Neck: supple, no paraspinal tenderness, full range of motion Heart:  Regular rate and rhythm Lungs:  Clear to auscultation bilaterally Back: No paraspinal tenderness Neurological Exam: alert and oriented to person, place, and time. Attention span and concentration intact, recent and remote memory intact, fund of knowledge intact.  Speech fluent and not dysarthric, language intact.  CN II-XII intact. Bulk and tone normal, muscle strength 5/5 throughout.  Sensation to light touch  intact.  Deep tendon reflexes 2+ throughout.  Finger to nose testing intact.  Gait normal, Romberg negative.  IMPRESSION: Migraine without aura  PLAN: 1.  Continue topiramate IR 200mg  at bedtime for now.  She will contact once she will be able to  switch over to ER. 2.  Zembrace SymTouch if needed.  Otherwise, Excedrin Migraine and Zofran. 3.  Follow up in 4 months.  [place], DO

## 2017-06-16 NOTE — Patient Instructions (Addendum)
1.  Continue topiramate 200mg  at bedtime.  Contact when you are ready to start the ER 2.  Use the Zembrace Symtouch shot for severe migraine attack 3.  Follow up in 4 months.

## 2017-08-07 ENCOUNTER — Encounter: Payer: Self-pay | Admitting: Neurology

## 2017-09-09 ENCOUNTER — Ambulatory Visit: Payer: Medicare Other | Admitting: Neurology

## 2017-09-19 ENCOUNTER — Other Ambulatory Visit: Payer: Self-pay

## 2017-09-19 DIAGNOSIS — G935 Compression of brain: Secondary | ICD-10-CM

## 2017-09-19 NOTE — Progress Notes (Signed)
Called Pt, LMOVM advising Dr Everlena Cooper wants her to have MRI w w/o prior to her July appointment. I advised the order has been sent to [facility] and left the phone number for scheduling. I asked that she call to confirm she rcvd my message.

## 2017-10-18 ENCOUNTER — Ambulatory Visit (HOSPITAL_BASED_OUTPATIENT_CLINIC_OR_DEPARTMENT_OTHER)
Admission: RE | Admit: 2017-10-18 | Discharge: 2017-10-18 | Disposition: A | Discharge status: Home / Self Care | Payer: Medicare Other | Source: Ambulatory Visit | Attending: Neurology | Admitting: Neurology

## 2017-10-18 DIAGNOSIS — G935 Compression of brain: Secondary | ICD-10-CM | POA: Diagnosis present

## 2017-10-18 MED ORDER — GADOBENATE DIMEGLUMINE 529 MG/ML IV SOLN
15.0000 mL | Freq: Once | INTRAVENOUS | Status: AC | PRN
  Administered 2017-10-18: 15 mL via INTRAVENOUS

## 2017-10-20 ENCOUNTER — Telehealth: Payer: Self-pay

## 2017-10-20 NOTE — Telephone Encounter (Signed)
Spoke with pt relaying message below.  Pt appreciative of my call.

## 2017-10-20 NOTE — Telephone Encounter (Signed)
-----   Message from Octaviano Batty Tat, DO sent at 10/20/2017  7:46 AM EDT ----- Let pt know that MRI brain normal

## 2017-10-21 ENCOUNTER — Ambulatory Visit: Payer: Medicare Other | Admitting: Neurology

## 2017-11-27 ENCOUNTER — Encounter

## 2017-11-27 ENCOUNTER — Ambulatory Visit: Payer: Medicare Other | Admitting: Neurology

## 2018-01-09 ENCOUNTER — Other Ambulatory Visit: Payer: Self-pay | Admitting: Obstetrics and Gynecology

## 2018-01-09 DIAGNOSIS — Z1231 Encounter for screening mammogram for malignant neoplasm of breast: Secondary | ICD-10-CM

## 2018-02-03 ENCOUNTER — Inpatient Hospital Stay: Admission: RE | Admit: 2018-02-03 | Payer: Medicare Other | Source: Ambulatory Visit

## 2018-06-04 ENCOUNTER — Emergency Department (HOSPITAL_BASED_OUTPATIENT_CLINIC_OR_DEPARTMENT_OTHER): Payer: Medicare Other

## 2018-06-04 ENCOUNTER — Other Ambulatory Visit: Payer: Self-pay

## 2018-06-04 ENCOUNTER — Emergency Department (HOSPITAL_BASED_OUTPATIENT_CLINIC_OR_DEPARTMENT_OTHER)
Admission: EM | Admit: 2018-06-04 | Discharge: 2018-06-04 | Disposition: A | Discharge status: Home / Self Care | Payer: Medicare Other | Attending: Emergency Medicine | Admitting: Emergency Medicine

## 2018-06-04 ENCOUNTER — Encounter (HOSPITAL_BASED_OUTPATIENT_CLINIC_OR_DEPARTMENT_OTHER): Payer: Self-pay | Admitting: Emergency Medicine

## 2018-06-04 DIAGNOSIS — N23 Unspecified renal colic: Secondary | ICD-10-CM | POA: Insufficient documentation

## 2018-06-04 DIAGNOSIS — Z79899 Other long term (current) drug therapy: Secondary | ICD-10-CM | POA: Diagnosis not present

## 2018-06-04 DIAGNOSIS — N760 Acute vaginitis: Secondary | ICD-10-CM | POA: Insufficient documentation

## 2018-06-04 DIAGNOSIS — B9689 Other specified bacterial agents as the cause of diseases classified elsewhere: Secondary | ICD-10-CM | POA: Diagnosis not present

## 2018-06-04 DIAGNOSIS — R102 Pelvic and perineal pain: Secondary | ICD-10-CM | POA: Diagnosis present

## 2018-06-04 LAB — WET PREP, GENITAL
Sperm: NONE SEEN
Trich, Wet Prep: NONE SEEN
Yeast Wet Prep HPF POC: NONE SEEN

## 2018-06-04 LAB — COMPREHENSIVE METABOLIC PANEL
ALT: 16 U/L (ref 0–44)
AST: 16 U/L (ref 15–41)
Albumin: 4.6 g/dL (ref 3.5–5.0)
Alkaline Phosphatase: 59 U/L (ref 38–126)
Anion gap: 6 (ref 5–15)
BUN: 20 mg/dL (ref 6–20)
CO2: 25 mmol/L (ref 22–32)
CREATININE: 1.03 mg/dL — AB (ref 0.44–1.00)
Calcium: 9.6 mg/dL (ref 8.9–10.3)
Chloride: 106 mmol/L (ref 98–111)
GFR calc Af Amer: >60 mL/min (ref >60)
GFR calc non Af Amer: >60 mL/min (ref >60)
Glucose, Bld: 82 mg/dL (ref 70–99)
Potassium: 3.8 mmol/L (ref 3.5–5.1)
Sodium: 137 mmol/L (ref 135–145)
Total Bilirubin: 0.5 mg/dL (ref 0.3–1.2)
Total Protein: 7.6 g/dL (ref 6.5–8.1)

## 2018-06-04 LAB — URINALYSIS, ROUTINE W REFLEX MICROSCOPIC
BILIRUBIN URINE: NEGATIVE
GLUCOSE, UA: NEGATIVE mg/dL
Ketones, ur: NEGATIVE mg/dL
Leukocytes, UA: NEGATIVE
Nitrite: NEGATIVE
PROTEIN: NEGATIVE mg/dL
Specific Gravity, Urine: 1.025 (ref 1.005–1.030)
pH: 5.5 (ref 5.0–8.0)

## 2018-06-04 LAB — CBC WITH DIFFERENTIAL/PLATELET
Abs Immature Granulocytes: 0.02 10*3/uL (ref 0.00–0.07)
Basophils Absolute: 0 10*3/uL (ref 0.0–0.1)
Basophils Relative: 0 %
Eosinophils Absolute: 0.2 10*3/uL (ref 0.0–0.5)
Eosinophils Relative: 3 %
HCT: 43.6 % (ref 36.0–46.0)
Hemoglobin: 13.8 g/dL (ref 12.0–15.0)
Immature Granulocytes: 0 %
Lymphocytes Relative: 35 %
Lymphs Abs: 2 10*3/uL (ref 0.7–4.0)
MCH: 29.4 pg (ref 26.0–34.0)
MCHC: 31.7 g/dL (ref 30.0–36.0)
MCV: 92.8 fL (ref 80.0–100.0)
MONOS PCT: 4 %
Monocytes Absolute: 0.3 10*3/uL (ref 0.1–1.0)
Neutro Abs: 3.3 10*3/uL (ref 1.7–7.7)
Neutrophils Relative %: 58 %
Platelets: 193 10*3/uL (ref 150–400)
RBC: 4.7 MIL/uL (ref 3.87–5.11)
RDW: 12.2 % (ref 11.5–15.5)
WBC: 5.8 10*3/uL (ref 4.0–10.5)
nRBC: 0 % (ref 0.0–0.2)

## 2018-06-04 LAB — PREGNANCY, URINE: Preg Test, Ur: NEGATIVE

## 2018-06-04 LAB — URINALYSIS, MICROSCOPIC (REFLEX): RBC / HPF: >50 RBC/hpf (ref 0–5)

## 2018-06-04 MED ORDER — IBUPROFEN 600 MG PO TABS: 600.0000 mg | ORAL_TABLET | Freq: Four times a day (QID) | ORAL | 0 refills | Status: AC | PRN

## 2018-06-04 MED ORDER — HYDROMORPHONE HCL 1 MG/ML IJ SOLN
1.0000 mg | Freq: Once | INTRAMUSCULAR | Status: AC
  Administered 2018-06-04: 1 mg via INTRAVENOUS
  Filled 2018-06-04: qty 1

## 2018-06-04 MED ORDER — METRONIDAZOLE 500 MG PO TABS
500.0000 mg | ORAL_TABLET | Freq: Once | ORAL | Status: AC
  Administered 2018-06-04: 500 mg via ORAL
  Filled 2018-06-04: qty 1

## 2018-06-04 MED ORDER — METRONIDAZOLE 500 MG PO TABS: 500.0000 mg | ORAL_TABLET | Freq: Two times a day (BID) | ORAL | 0 refills | Status: DC

## 2018-06-04 MED ORDER — MORPHINE SULFATE (PF) 4 MG/ML IV SOLN
4.0000 mg | Freq: Once | INTRAVENOUS | Status: AC
  Administered 2018-06-04: 4 mg via INTRAVENOUS
  Filled 2018-06-04: qty 1

## 2018-06-04 MED ORDER — HYDROCODONE-ACETAMINOPHEN 5-325 MG PO TABS: 1.0000 | ORAL_TABLET | Freq: Four times a day (QID) | ORAL | 0 refills | Status: DC | PRN

## 2018-06-04 MED ORDER — KETOROLAC TROMETHAMINE 30 MG/ML IJ SOLN
30.0000 mg | Freq: Once | INTRAMUSCULAR | Status: AC
  Administered 2018-06-04: 30 mg via INTRAVENOUS
  Filled 2018-06-04: qty 1

## 2018-06-04 MED ORDER — TAMSULOSIN HCL 0.4 MG PO CAPS: 0.4000 mg | ORAL_CAPSULE | Freq: Every day | ORAL | 0 refills | Status: DC

## 2018-06-04 MED ORDER — SODIUM CHLORIDE 0.9 % IV BOLUS
1000.0000 mL | Freq: Once | INTRAVENOUS | Status: AC
  Administered 2018-06-04: 1000 mL via INTRAVENOUS

## 2018-06-04 NOTE — ED Triage Notes (Signed)
Reports recently felt as though she had UTI and took leftover penicillin that she had at home yesterday and this morning.  C/o pelvic pain which began yesterday.  Denies flank pain.  Denies fevers.  C/o nausea without vomiting.  Denies diarrhea.

## 2018-06-04 NOTE — ED Notes (Signed)
Pt curled up on the bed and tearful. Pt states pain is "5"/10.

## 2018-06-04 NOTE — ED Provider Notes (Signed)
[facility] EMERGENCY DEPARTMENT Provider Note   CSN: [id number] Arrival date & time: 06/04/18  1638     History   Chief Complaint Chief Complaint  Patient presents with  . Pelvic Pain    HPI Angelica Butler is a 53 y.o. female hx of rheumatoid arthritis, renal cysts here with flank pain, pelvic pain.  Patient states that Angelica Butler has acute onset of pelvic pain since yesterday.  Patient states that it was cramping sensation and felt like labor.  Angelica Butler also felt like Angelica Butler may have a yeast infection but denies sexual activity for the last several months.  Denies any vaginal discharge.  Patient states that Angelica Butler does have some urgency and frequency but no dysuria.  Patient denies any fevers or vomiting.  Patient with that Angelica Butler has some flank pain as well.   The history is provided by the patient.    Past Medical History:  Diagnosis Date  . Knee pain, chronic   . 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 Rheumatoid arthritis [facility])     Patient Active Problem List   Diagnosis Date Noted  . LOM 06/26/2007  . CONSTIPATION 02/02/2007  . CONSTIPATION, OTHER 02/02/2007    Past Surgical History:  Procedure Laterality Date  . CESAREAN SECTION    . ENDOMETRIAL ABLATION    . RADIOFREQUENCY ABLATION NERVES       OB History   No obstetric history on file.      Home Medications    Prior to Admission medications   Medication Sig Start Date End Date Taking? Authorizing Provider  ALPRAZolam Prudy Feeler) 1 MG tablet Take 1 mg by mouth at bedtime as needed for anxiety.    [provider]  Brexpiprazole 2 MG TABS Take 2 mg by mouth daily at 12 noon.    [provider]  FLUoxetine (PROZAC) 10 MG tablet Take 10 mg by mouth daily.    [provider]  lisdexamfetamine (VYVANSE) 70 MG capsule Take 70 mg by mouth daily.    [provider]  metoCLOPramide (REGLAN) 10 MG tablet Take 1 tablet (10 mg total) by mouth every 6 (six) hours as needed for nausea  (nausea/headache). 06/24/16   Rolan Bucco, MD  metoprolol succinate (TOPROL-XL) 100 MG 24 hr tablet Take 100 mg by mouth daily. Take with or immediately following a meal.    [provider]  oxyCODONE-acetaminophen (PERCOCET) 10-325 MG tablet Take 1 tablet by mouth every 4 (four) hours as needed for pain.    [provider]  pantoprazole (PROTONIX) 20 MG tablet Take 1 tablet (20 mg total) by mouth daily. 06/24/16   Rolan Bucco, MD  sucralfate (CARAFATE) 1 g tablet Take 1 tablet (1 g total) by mouth 4 (four) times daily -  with meals and at bedtime. 06/24/16   Rolan Bucco, MD  SUMAtriptan Succinate (ZEMBRACE SYMTOUCH) 3 MG/0.5ML SOAJ Inject 3 mg into the skin as directed. 02/26/17   Drema Dallas, DO  tapentadol (NUCYNTA) 50 MG TABS tablet Take 200 mg by mouth.    [provider]  topiramate (TOPAMAX) 100 MG tablet Take 200 mg by mouth 2 (two) times daily.    [provider]  Topiramate ER (TROKENDI XR) 200 MG CP24 Take 200 mg by mouth at bedtime. 02/26/17   Drema Dallas, DO  Vitamin D, Ergocalciferol, (DRISDOL) 50000 UNITS CAPS capsule Take 50,000 Units by mouth every 7 (seven) days.    [provider]  Family History Family History  Problem Relation Age of Onset  . Breast cancer Maternal Aunt   . Breast cancer Paternal Grandmother     Social History Social History   Tobacco Use  . Smoking status: Never Smoker  . Smokeless tobacco: Never Used  Substance Use Topics  . Alcohol use: No  . Drug use: No     Allergies   Patient has no known allergies.   Review of Systems Review of Systems  Genitourinary: Positive for pelvic pain.  All other systems reviewed and are negative.    Physical Exam Updated Vital Signs BP 135/89 (BP Location: Right Arm)   Pulse 65   Temp 97.7 F (36.5 C) (Oral)   Resp 20   Ht 5\' 2"  (1.575 m)   Wt 69.9 kg   SpO2 100%   BMI 28.17 kg/m   Physical Exam Vitals signs and nursing note reviewed.    Constitutional:      Appearance: Normal appearance.  HENT:     Head: Normocephalic.     Nose: Nose normal.     Mouth/Throat:     Mouth: Mucous membranes are moist.  Eyes:     Extraocular Movements: Extraocular movements intact.     Pupils: Pupils are equal, round, and reactive to light.  Neck:     Musculoskeletal: Normal range of motion.  Cardiovascular:     Rate and Rhythm: Normal rate and regular rhythm.  Pulmonary:     Effort: Pulmonary effort is normal.     Breath sounds: Normal breath sounds.  Abdominal:     General: Abdomen is flat.     Palpations: Abdomen is soft.     Comments: + pelvic vs suprapubic tenderness   Genitourinary:    Comments: No obvious CMT or adnexal tenderness  Musculoskeletal: Normal range of motion.  Skin:    General: Skin is warm.     Capillary Refill: Capillary refill takes less than 2 seconds.  Neurological:     General: No focal deficit present.     Mental Status: Angelica Butler is alert and oriented to person, place, and time.  Psychiatric:        Mood and Affect: Mood normal.      ED Treatments / Results  Labs (all labs ordered are listed, but only abnormal results are displayed) Labs Reviewed  WET PREP, GENITAL - Abnormal; Notable for the following components:      Result Value   Clue Cells Wet Prep HPF POC PRESENT (*)    WBC, Wet Prep HPF POC FEW (*)    All other components within normal limits  URINALYSIS, ROUTINE W REFLEX MICROSCOPIC - Abnormal; Notable for the following components:   Hgb urine dipstick LARGE (*)    All other components within normal limits  URINALYSIS, MICROSCOPIC (REFLEX) - Abnormal; Notable for the following components:   Bacteria, UA RARE (*)    All other components within normal limits  COMPREHENSIVE METABOLIC PANEL - Abnormal; Notable for the following components:   Creatinine, Ser 1.03 (*)    All other components within normal limits  URINE CULTURE  PREGNANCY, URINE  CBC WITH DIFFERENTIAL/PLATELET   GC/CHLAMYDIA PROBE AMP ([place]) NOT AT [facility]    EKG None  Radiology US Transvaginal Non-ob  Result Date: 06/04/2018 CLINICAL DATA:  Initial evaluation for acute left flank and pelvic pain with vaginal pain. History of premature ovarian failure. EXAM: ULTRASOUND PELVIS TRANSVAGINAL TECHNIQUE: Transvaginal ultrasound examination of the pelvis was performed including evaluation of the uterus,  ovaries, adnexal regions, and pelvic cul-de-sac. COMPARISON:  Concomitant abdominal ultrasound performed on the same day. FINDINGS: Uterus Measurements: 4.8 x 2.1 x 2.8 cm = volume: 14 mL. No fibroids or other mass visualized. Endometrium Thickness: 4 mm.  No focal abnormality visualized. Right ovary Not visualized.  No adnexal mass. Left ovary Not visualized.  No adnexal mass. Other findings:  No abnormal free fluid IMPRESSION: 1. No acute abnormality within the pelvis. 2. Nonvisualization of either ovary.  No pelvic or adnexal mass. 3. Normal sonographic appearance of the uterus and endometrium. Electronically Signed   By: Rise MuBenjamin  McClintock M.D.   On: 06/04/2018 18:40   [place] Renal  Result Date: 06/04/2018 CLINICAL DATA:  Left flank pain.  Rule out stone. EXAM: RENAL / URINARY TRACT ULTRASOUND COMPLETE COMPARISON:  CT scan June 24, 2016 FINDINGS: Right Kidney: Renal measurements: 9.8 x 4.7 x 5.7 cm = volume: 136 mL. There is a complex cystic mass in the mid to lower left kidney measuring 1.8 x 1.8 x 2.6 cm. This had the appearance of a cyst on the previous CT scan from June 24, 2016 and March 15, 2015. This cyst measured 1.7 x 1.8 x 2.6 cm on the CT scan from June 24, 2016. This mass measured 1.2 by 1.3 by 1.7 cm in February of 2013. No hydronephrosis. Left Kidney: Renal measurements: 12.6 x 6 x 4.9 cm = volume: 195 mL. Mild hydronephrosis. No mass or stone noted. Bladder: Poorly evaluated due to lack of distention. IMPRESSION: 1. There is a mass in the mid to lower right kidney which correlates  with the cyst seen on the CT scan from June 24, 2016. The mass is essentially unchanged in size since 2018 but has grown since 2013. The mass did not meet criteria for a simple cyst in 2013 and follow-up imaging was recommended. The mass appears to represent a cyst with multiple septations today. An MRI could more definitively characterize. 2. Mild left hydronephrosis. Given the history of flank pain, a ureteral stone is not excluded based on this study. Electronically Signed   By: Gerome Samavid  Williams III M.D   On: 06/04/2018 18:55    Procedures Procedures (including critical care time)  Medications Ordered in ED Medications  sodium chloride 0.9 % bolus 1,000 mL (0 mLs Intravenous Stopped 06/04/18 1902)  morphine 4 MG/ML injection 4 mg (4 mg Intravenous Given 06/04/18 1722)  ketorolac (TORADOL) 30 MG/ML injection 30 mg (30 mg Intravenous Given 06/04/18 1833)  HYDROmorphone (DILAUDID) injection 1 mg (1 mg Intravenous Given 06/04/18 1833)  metroNIDAZOLE (FLAGYL) tablet 500 mg (500 mg Oral Given 06/04/18 1902)     Initial Impression / Assessment and Plan / ED Course  I have reviewed the triage vital signs and the nursing notes.  Pertinent labs & imaging results that were available during my care of the patient were reviewed by me and considered in my medical decision making (see chart for details).     Nolon Lennertrin M Newborn is a 53 y.o. female here with pelvic pain. Consider UTI vs ovarian cyst vs renal colic. Will get labs, UA, pelvic exam.   7:02 PM WBC nl. UA + blood but no UTI. Wet prep + BV. US showed no ovarian cyst. Also mild L hydro. Pain controlled now. Pain likely from renal colic and BV. Will dc home with motrin, vicodin, flagyl, flomax, urology follow up.    Final Clinical Impressions(s) / ED Diagnoses   Final diagnoses:  None    ED Discharge Orders  None       Charlynne Pander, MD 06/04/18 [phone]

## 2018-06-04 NOTE — Discharge Instructions (Addendum)
Take motrin for pain.   Take vicodin for severe pain. Do NOT drive with it   Take flagyl twice daily for a week.   Take flomax daily.   See urology for follow up   Return to ER if you have worse abdominal pain, vomiting, vaginal discharge

## 2018-06-05 LAB — URINE CULTURE: CULTURE: NO GROWTH

## 2018-06-05 LAB — GC/CHLAMYDIA PROBE AMP (~~LOC~~) NOT AT ARMC
Chlamydia: NEGATIVE
Neisseria Gonorrhea: NEGATIVE

## 2019-05-13 ENCOUNTER — Other Ambulatory Visit: Payer: Self-pay | Admitting: Obstetrics and Gynecology

## 2019-05-13 DIAGNOSIS — N6320 Unspecified lump in the left breast, unspecified quadrant: Secondary | ICD-10-CM

## 2019-05-19 ENCOUNTER — Other Ambulatory Visit: Payer: Self-pay

## 2019-05-19 ENCOUNTER — Ambulatory Visit
Admission: RE | Admit: 2019-05-19 | Discharge: 2019-05-19 | Disposition: A | Discharge status: Home / Self Care | Payer: Medicare Other | Source: Ambulatory Visit | Attending: Obstetrics and Gynecology | Admitting: Obstetrics and Gynecology

## 2019-05-19 DIAGNOSIS — N6320 Unspecified lump in the left breast, unspecified quadrant: Secondary | ICD-10-CM

## 2020-01-04 LAB — HM COLONOSCOPY

## 2020-02-03 IMAGING — MG DIGITAL DIAGNOSTIC BILAT W/ TOMO W/ CAD
6 of 10 series · 6 of 30 positions shown · non-contrast
Comparison: 01/07/2017 and earlier

CLINICAL DATA: Palpable abnormality in the LEFT breast found on
recent physical exam.

EXAM:
DIGITAL DIAGNOSTIC BILATERAL MAMMOGRAM WITH CAD AND TOMO
ULTRASOUND LEFT BREAST

[R MLO synth-2D]
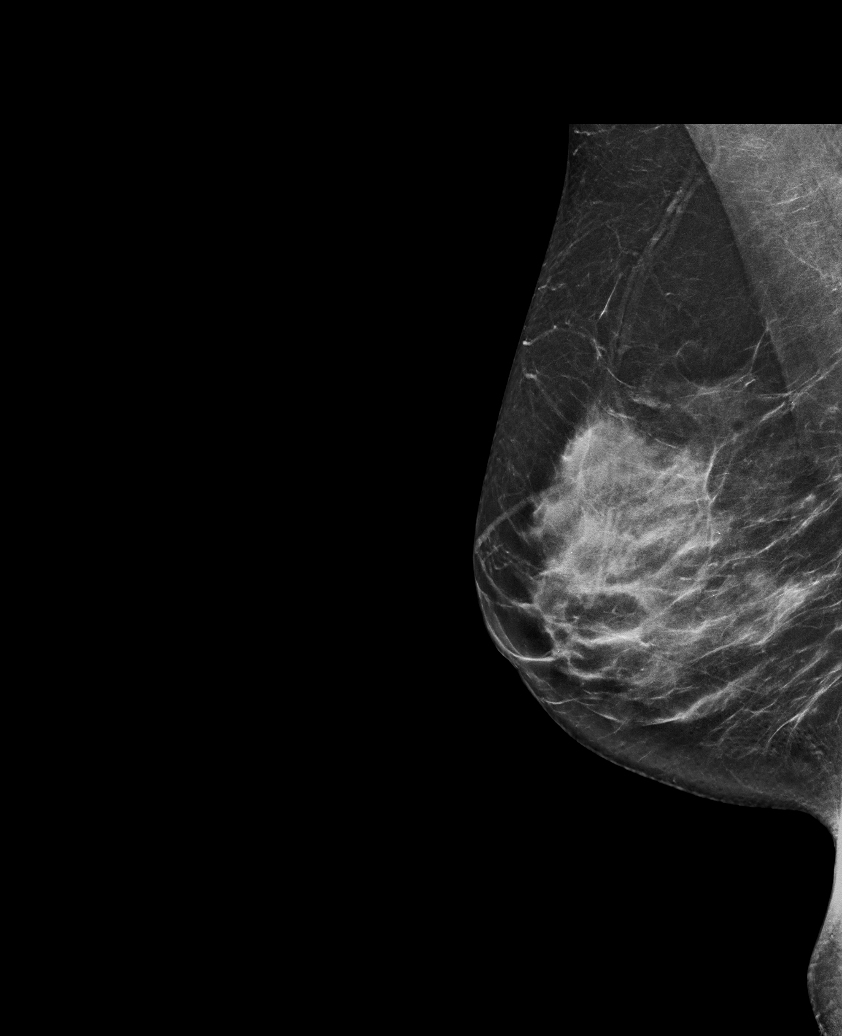

[L CC synth-2D]
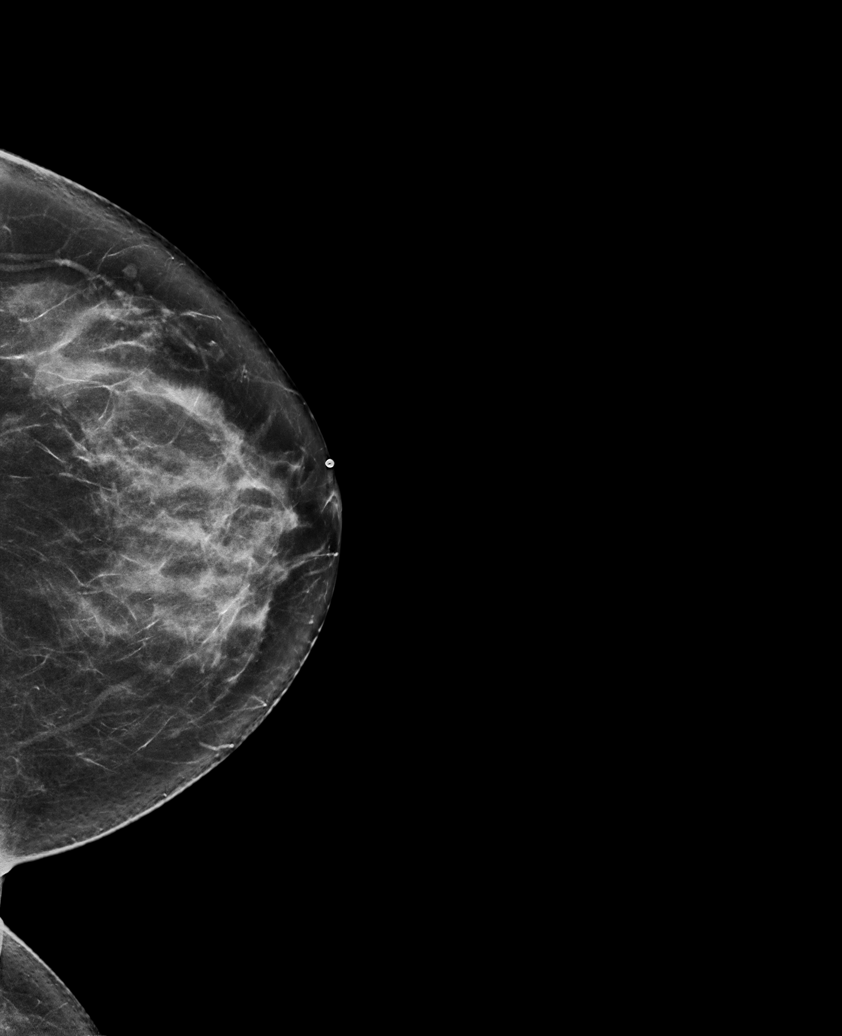

[R CC synth-2D]
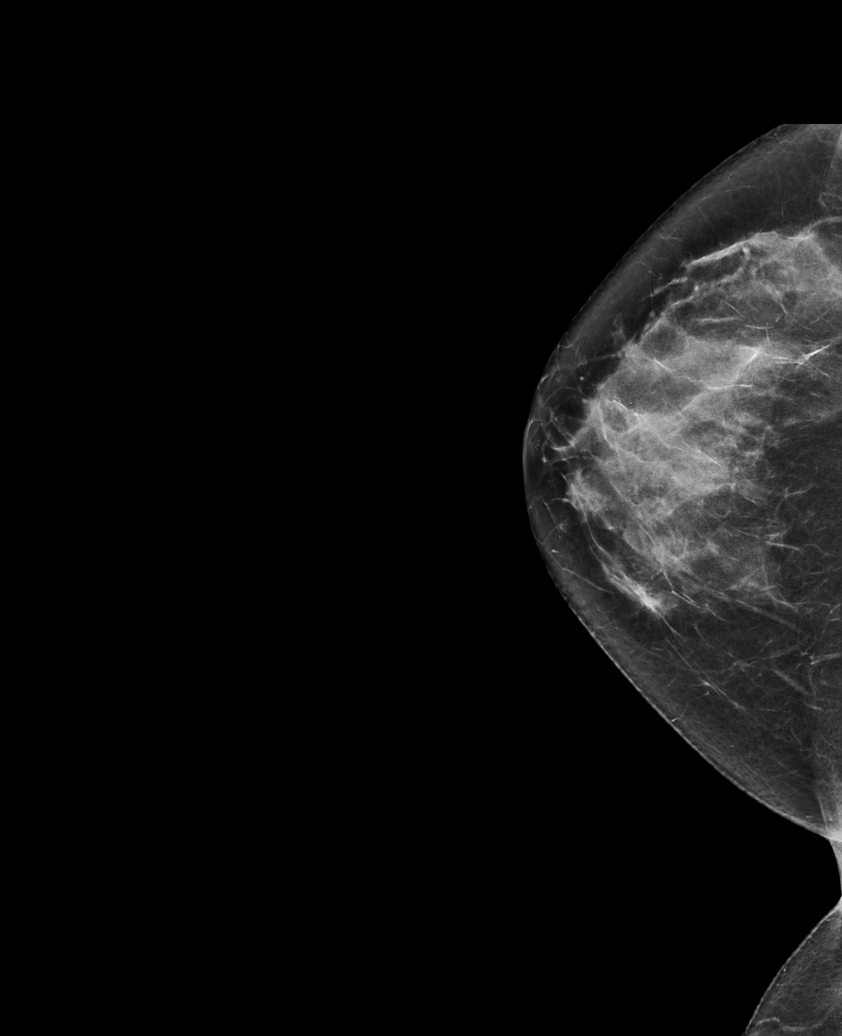

[L MLO synth-2D (1 of 2)]
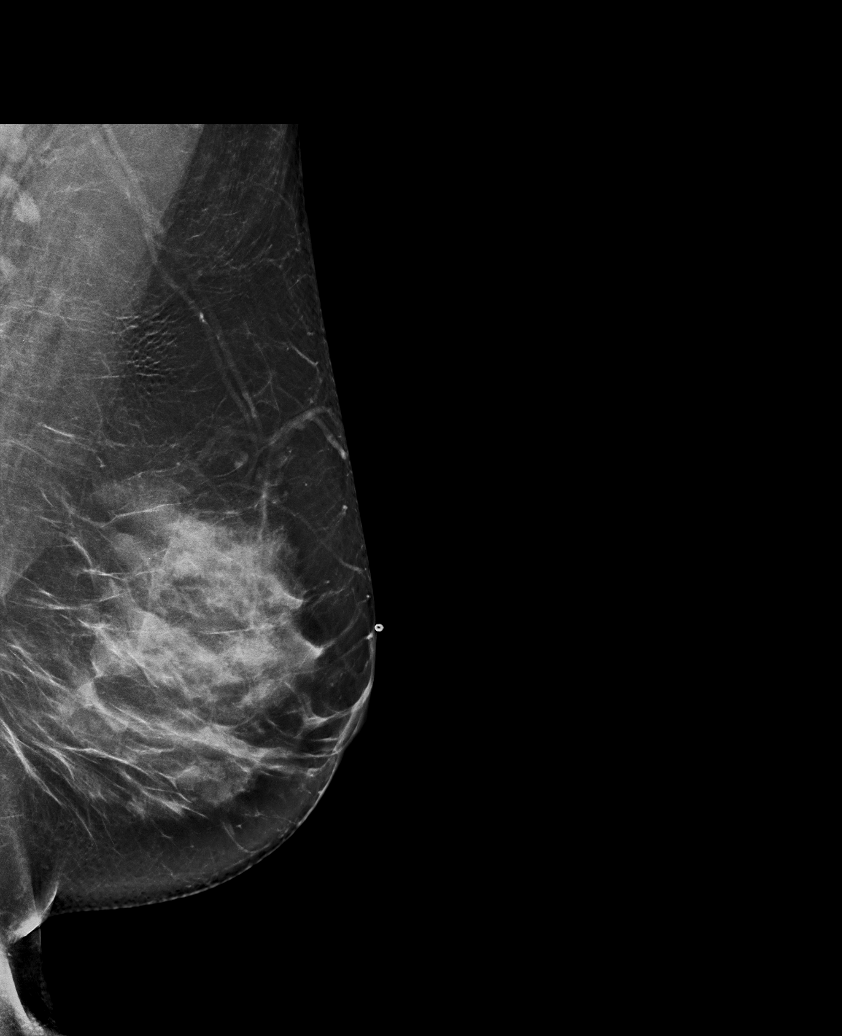

[L MLO synth-2D (2 of 2)]
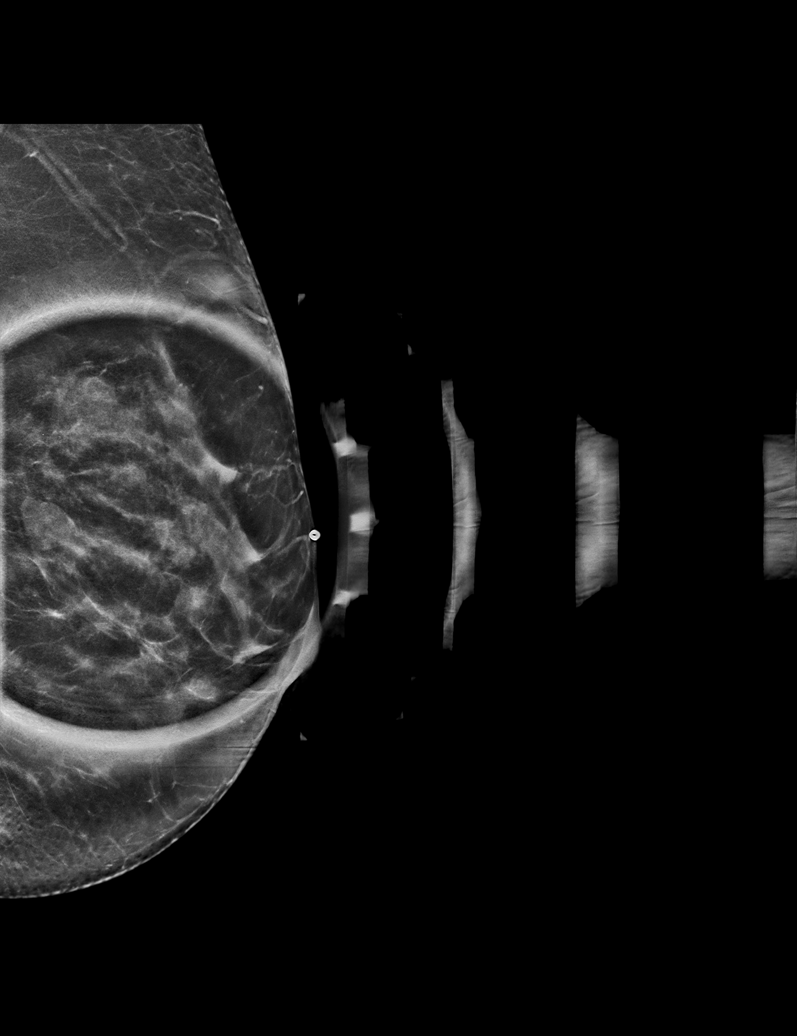

[R MLO tomo · tomo slice 37/74.0]
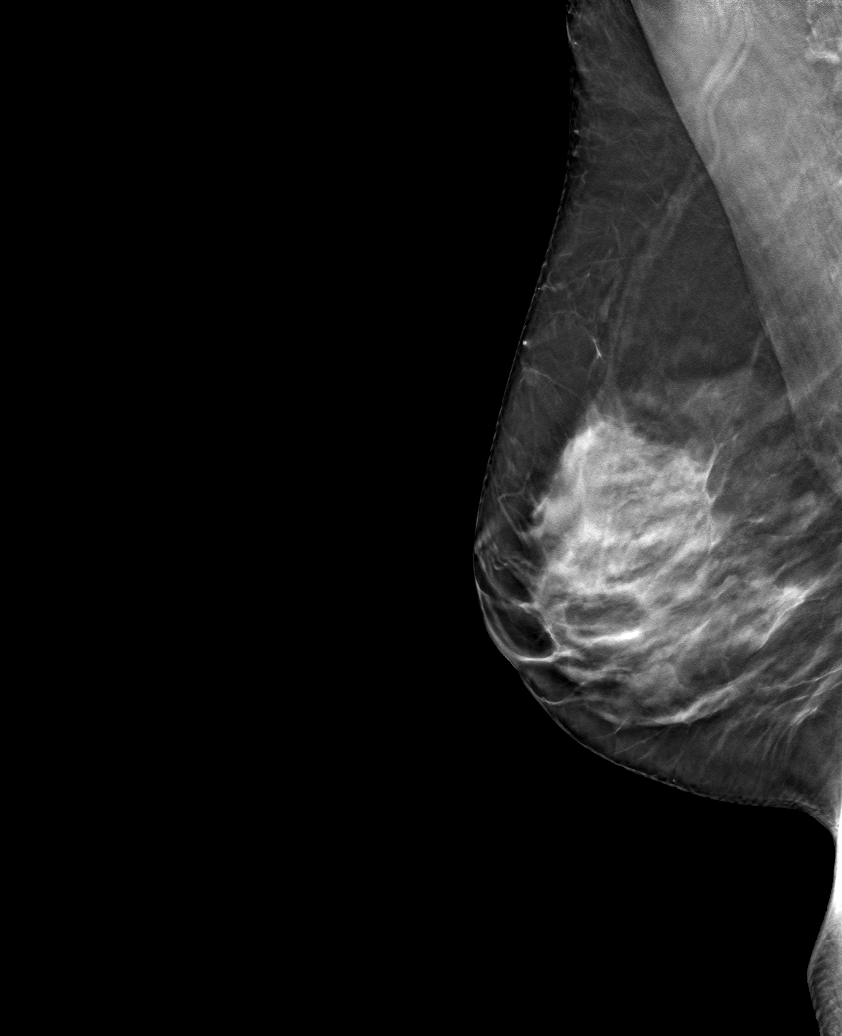

[6 of 30 positions shown; findings below may reference images not displayed]

ACR Breast Density Category c: The breast tissue is heterogeneously
dense, which may obscure small masses.
FINDINGS: RIGHT breast is negative.

Spot tangential view performed in the area of concern in the central
LEFT breast demonstrates numerous partially obscured oval
circumscribed masses. No distortion or suspicious mass identified
mammographically.

Mammographic images were processed with CAD.

On physical exam, I palpate soft nodularity in the UPPER central
portion of the LEFT breast.

Targeted ultrasound is performed, showing numerous simple cysts
within the central portion of the LEFT breast.

In the 12 o'clock location 2 centimeters from nipple cyst is 1.1 x
1.1 x 0.8 centimeters.

A cyst in the 1 o'clock location 3 centimeters from the nipple is
0.8 x 0.8 x 0.5 centimeters.

In the 3 o'clock location 5 centimeters from the nipple a simple
cyst is 1.1 x 1.1 x 0.7 centimeters.

No solid masses or areas of acoustic shadowing.
IMPRESSION: 1.  No mammographic or ultrasound evidence for malignancy.
2. Numerous benign cysts in the LEFT breast.

RECOMMENDATION:
Screening mammogram in one year.(Code:[id number])

I have discussed the findings and recommendations with the patient.
If applicable, a reminder letter will be sent to the patient
regarding the next appointment.

BI-RADS CATEGORY  2: Benign.

## 2021-03-12 ENCOUNTER — Encounter (HOSPITAL_BASED_OUTPATIENT_CLINIC_OR_DEPARTMENT_OTHER): Payer: Self-pay

## 2021-03-12 ENCOUNTER — Emergency Department (HOSPITAL_BASED_OUTPATIENT_CLINIC_OR_DEPARTMENT_OTHER)
Admission: EM | Admit: 2021-03-12 | Discharge: 2021-03-12 | Disposition: A | Discharge status: Home / Self Care | Payer: Medicare Other | Attending: Emergency Medicine | Admitting: Emergency Medicine

## 2021-03-12 ENCOUNTER — Other Ambulatory Visit: Payer: Self-pay

## 2021-03-12 ENCOUNTER — Emergency Department (HOSPITAL_BASED_OUTPATIENT_CLINIC_OR_DEPARTMENT_OTHER): Payer: Medicare Other

## 2021-03-12 DIAGNOSIS — R102 Pelvic and perineal pain: Secondary | ICD-10-CM | POA: Diagnosis present

## 2021-03-12 DIAGNOSIS — N132 Hydronephrosis with renal and ureteral calculous obstruction: Secondary | ICD-10-CM | POA: Diagnosis not present

## 2021-03-12 DIAGNOSIS — N201 Calculus of ureter: Secondary | ICD-10-CM

## 2021-03-12 LAB — URINALYSIS, MICROSCOPIC (REFLEX)

## 2021-03-12 LAB — CBC WITH DIFFERENTIAL/PLATELET
Abs Immature Granulocytes: 0.02 10*3/uL (ref 0.00–0.07)
Basophils Absolute: 0 10*3/uL (ref 0.0–0.1)
Basophils Relative: 1 %
Eosinophils Absolute: 0.1 10*3/uL (ref 0.0–0.5)
Eosinophils Relative: 3 %
HCT: 42.6 % (ref 36.0–46.0)
Hemoglobin: 14.5 g/dL (ref 12.0–15.0)
Immature Granulocytes: 0 %
Lymphocytes Relative: 33 %
Lymphs Abs: 1.9 10*3/uL (ref 0.7–4.0)
MCH: 30.5 pg (ref 26.0–34.0)
MCHC: 34 g/dL (ref 30.0–36.0)
MCV: 89.5 fL (ref 80.0–100.0)
Monocytes Absolute: 0.3 10*3/uL (ref 0.1–1.0)
Monocytes Relative: 5 %
Neutro Abs: 3.3 10*3/uL (ref 1.7–7.7)
Neutrophils Relative %: 58 %
Platelets: 227 10*3/uL (ref 150–400)
RBC: 4.76 MIL/uL (ref 3.87–5.11)
RDW: 12.5 % (ref 11.5–15.5)
WBC: 5.6 10*3/uL (ref 4.0–10.5)
nRBC: 0 % (ref 0.0–0.2)

## 2021-03-12 LAB — COMPREHENSIVE METABOLIC PANEL
ALT: 11 U/L (ref 0–44)
AST: 17 U/L (ref 15–41)
Albumin: 4.6 g/dL (ref 3.5–5.0)
Alkaline Phosphatase: 52 U/L (ref 38–126)
Anion gap: 9 (ref 5–15)
BUN: 16 mg/dL (ref 6–20)
CO2: 26 mmol/L (ref 22–32)
Calcium: 9.5 mg/dL (ref 8.9–10.3)
Chloride: 106 mmol/L (ref 98–111)
Creatinine, Ser: 0.99 mg/dL (ref 0.44–1.00)
GFR, Estimated: >60 mL/min (ref >60)
Glucose, Bld: 84 mg/dL (ref 70–99)
Potassium: 3.4 mmol/L — ABNORMAL LOW (ref 3.5–5.1)
Sodium: 141 mmol/L (ref 135–145)
Total Bilirubin: 0.4 mg/dL (ref 0.3–1.2)
Total Protein: 7.5 g/dL (ref 6.5–8.1)

## 2021-03-12 LAB — LIPASE, BLOOD: Lipase: 33 U/L (ref 11–51)

## 2021-03-12 LAB — URINALYSIS, ROUTINE W REFLEX MICROSCOPIC
Bilirubin Urine: NEGATIVE
Glucose, UA: NEGATIVE mg/dL
Ketones, ur: NEGATIVE mg/dL
Leukocytes,Ua: NEGATIVE
Nitrite: NEGATIVE
Protein, ur: NEGATIVE mg/dL
Specific Gravity, Urine: 1.025 (ref 1.005–1.030)
pH: 7 (ref 5.0–8.0)

## 2021-03-12 LAB — PREGNANCY, URINE: Preg Test, Ur: NEGATIVE

## 2021-03-12 MED ORDER — SODIUM CHLORIDE 0.9 % IV BOLUS
1000.0000 mL | Freq: Once | INTRAVENOUS | Status: AC
  Administered 2021-03-12: 1000 mL via INTRAVENOUS

## 2021-03-12 MED ORDER — ONDANSETRON HCL 4 MG/2ML IJ SOLN
4.0000 mg | Freq: Once | INTRAMUSCULAR | Status: AC
  Administered 2021-03-12: 4 mg via INTRAVENOUS
  Filled 2021-03-12: qty 2

## 2021-03-12 MED ORDER — IOHEXOL 300 MG/ML  SOLN
100.0000 mL | Freq: Once | INTRAMUSCULAR | Status: AC | PRN
  Administered 2021-03-12: 100 mL via INTRAVENOUS

## 2021-03-12 MED ORDER — HYDROMORPHONE HCL 1 MG/ML IJ SOLN
1.0000 mg | Freq: Once | INTRAMUSCULAR | Status: AC
Start: 2021-03-12 — End: 2021-03-12
  Administered 2021-03-12: 1 mg via INTRAVENOUS
  Filled 2021-03-12: qty 1

## 2021-03-12 NOTE — ED Notes (Signed)
ED Provider at bedside. 

## 2021-03-12 NOTE — ED Notes (Signed)
Patient transported to CT 

## 2021-03-12 NOTE — Discharge Instructions (Addendum)
If you develop fever, vomiting, uncontrolled pain, urinary tract infection symptoms, or any other new/concerning symptoms then return to the ER for evaluation. 

## 2021-03-12 NOTE — ED Provider Notes (Signed)
[facility] Provider Note   CSN: [id number] Arrival date & time: 03/12/21  0801     History Chief Complaint  Patient presents with   Abdominal Pain    Angelica Butler is a 55 y.o. female.  HPI 55 year old female presents with acute, severe abdominal pain.  Started in the middle of the night a few hours ago.  Mostly started in her lower abdomen/pelvis but now feels like she has diffuse abdominal pain.  She feels the pain is so bad is causing her to be nauseated.  She felt like she had a fever (temp 100.1) yesterday evening and thought she might be starting to have the flu.  She denies any back pain, hematuria, diarrhea.  However does feel like she has to urinate often.  Pain is rated as severe.  Past Medical History:  Diagnosis Date   Knee pain, chronic    Renal disorder    cysts on kidneys   Rheumatoid arthritis [facility])     Patient Active Problem List   Diagnosis Date Noted   LOM 06/26/2007   CONSTIPATION 02/02/2007   CONSTIPATION, OTHER 02/02/2007    Past Surgical History:  Procedure Laterality Date   CESAREAN SECTION     ENDOMETRIAL ABLATION     RADIOFREQUENCY ABLATION NERVES       OB History   No obstetric history on file.     Family History  Problem Relation Age of Onset   Breast cancer Maternal Aunt    Breast cancer Paternal Grandmother     Social History   Tobacco Use   Smoking status: Never   Smokeless tobacco: Never  Vaping Use   Vaping Use: Never used  Substance Use Topics   Alcohol use: No   Drug use: No    Home Medications Prior to Admission medications   Medication Sig Start Date End Date Taking? Authorizing Provider  ALPRAZolam Duanne Moron) 1 MG tablet Take 1 mg by mouth at bedtime as needed for anxiety.    [provider]  Brexpiprazole 2 MG TABS Take 2 mg by mouth daily at 12 noon.    [provider]  FLUoxetine (PROZAC) 10 MG tablet Take 10 mg by mouth daily.    [provider]   HYDROcodone-acetaminophen (NORCO/VICODIN) 5-325 MG tablet Take 1 tablet by mouth every 6 (six) hours as needed. 06/04/18   Drenda Freeze, MD  ibuprofen (ADVIL,MOTRIN) 600 MG tablet Take 1 tablet (600 mg total) by mouth every 6 (six) hours as needed. 06/04/18   Drenda Freeze, MD  lisdexamfetamine (VYVANSE) 70 MG capsule Take 70 mg by mouth daily.    [provider]  metoCLOPramide (REGLAN) 10 MG tablet Take 1 tablet (10 mg total) by mouth every 6 (six) hours as needed for nausea (nausea/headache). 06/24/16   Malvin Johns, MD  metoprolol succinate (TOPROL-XL) 100 MG 24 hr tablet Take 100 mg by mouth daily. Take with or immediately following a meal.    [provider]  metroNIDAZOLE (FLAGYL) 500 MG tablet Take 1 tablet (500 mg total) by mouth 2 (two) times daily. One po bid x 7 days 06/04/18   Drenda Freeze, MD  oxyCODONE-acetaminophen (PERCOCET) 10-325 MG tablet Take 1 tablet by mouth every 4 (four) hours as needed for pain.    [provider]  pantoprazole (PROTONIX) 20 MG tablet Take 1 tablet (20 mg total) by mouth daily. 06/24/16   Malvin Johns, MD  sucralfate (CARAFATE) 1 g tablet Take 1 tablet (  1 g total) by mouth 4 (four) times daily -  with meals and at bedtime. 06/24/16   Malvin Johns, MD  SUMAtriptan Succinate (ZEMBRACE SYMTOUCH) 3 MG/0.5ML SOAJ Inject 3 mg into the skin as directed. 02/26/17   Pieter Partridge, DO  tamsulosin (FLOMAX) 0.4 MG CAPS capsule Take 1 capsule (0.4 mg total) by mouth daily. 06/04/18   Drenda Freeze, MD  tapentadol (NUCYNTA) 50 MG TABS tablet Take 200 mg by mouth.    [provider]  topiramate (TOPAMAX) 100 MG tablet Take 200 mg by mouth 2 (two) times daily.    [provider]  Topiramate ER (TROKENDI XR) 200 MG CP24 Take 200 mg by mouth at bedtime. 02/26/17   Pieter Partridge, DO  Vitamin D, Ergocalciferol, (DRISDOL) 50000 UNITS CAPS capsule Take 50,000 Units by mouth every 7 (seven) days.    [provider]    Allergies    Patient has no known allergies.  Review of Systems   Review of Systems  Constitutional:  Positive for fever.  Gastrointestinal:  Positive for abdominal pain and nausea. Negative for diarrhea and vomiting.  Genitourinary:  Negative for dysuria and hematuria.  Musculoskeletal:  Negative for back pain.  All other systems reviewed and are negative.  Physical Exam Updated Vital Signs BP (!) 125/91   Pulse 76   Temp 98.6 F (37 C) (Oral)   Resp (!) 21   Ht 5\' 2"  (1.575 m)   Wt 69.9 kg   SpO2 100%   BMI 28.17 kg/m   Physical Exam Vitals and nursing note reviewed.  Constitutional:      General: She is in acute distress.     Appearance: She is well-developed.     Comments: In severe pain, crying  HENT:     Head: Normocephalic and atraumatic.     Right Ear: External ear normal.     Left Ear: External ear normal.     Nose: Nose normal.  Eyes:     General:        Right eye: No discharge.        Left eye: No discharge.  Cardiovascular:     Rate and Rhythm: Normal rate and regular rhythm.     Heart sounds: Normal heart sounds.  Pulmonary:     Effort: Pulmonary effort is normal.     Breath sounds: Normal breath sounds.  Abdominal:     Palpations: Abdomen is soft.     Tenderness: There is abdominal tenderness (she reports generalized pain. hard to tell if my palpation really makes it worse or if she's just already in pain).  Skin:    General: Skin is warm and dry.  Neurological:     Mental Status: She is alert.  Psychiatric:        Mood and Affect: Mood is not anxious.    ED Results / Procedures / Treatments   Labs (all labs ordered are listed, but only abnormal results are displayed) Labs Reviewed  COMPREHENSIVE METABOLIC PANEL - Abnormal; Notable for the following components:      Result Value   Potassium 3.4 (*)    All other components within normal limits  URINALYSIS, ROUTINE W REFLEX MICROSCOPIC - Abnormal; Notable for the  following components:   APPearance CLOUDY (*)    Hgb urine dipstick SMALL (*)    All other components within normal limits  URINALYSIS, MICROSCOPIC (REFLEX) - Abnormal; Notable for the following components:   Bacteria, UA FEW (*)  All other components within normal limits  CBC WITH DIFFERENTIAL/PLATELET  LIPASE, BLOOD  PREGNANCY, URINE    EKG EKG Interpretation  Date/Time:  Monday March 12 2021 08:34:07 EST Ventricular Rate:  77 PR Interval:  160 QRS Duration: 100 QT Interval:  412 QTC Calculation: 467 R Axis:   56 Text Interpretation: Sinus rhythm no acute ST/T changes Confirmed by Pricilla Loveless [phone]) on 03/12/2021 8:46:39 AM  Radiology CT ABDOMEN PELVIS W CONTRAST  Result Date: 03/12/2021 CLINICAL DATA:  Diverticulitis suspected. Lower abdominal pain beginning today. Fever. Nausea. EXAM: CT ABDOMEN AND PELVIS WITH CONTRAST TECHNIQUE: Multidetector CT imaging of the abdomen and pelvis was performed using the standard protocol following bolus administration of intravenous contrast. CONTRAST:  OMNIPAQUE IOHEXOL 300 MG/ML  SOLN COMPARISON:  06/24/2016 FINDINGS: Lower chest: Normal Hepatobiliary: Liver parenchyma is normal.  No calcified gallstones. Pancreas: Normal, with some generalized fatty change. Spleen: Normal Adrenals/Urinary Tract: Adrenal glands are normal. Kidneys show a few small cysts. 1 mm stone in the lower pole of the right kidney. Mild right hydroureteronephrosis. 3 x 4 mm stone at the right UVJ. No stone in the bladder. Stomach/Bowel: Stomach and small intestine are normal. No colon abnormality. No diverticulosis. Vascular/Lymphatic: Normal Reproductive: Normal Other: No free fluid or air. Musculoskeletal: Normal IMPRESSION: 3 x 4 mm stone at the right UVJ with mild right hydroureteronephrosis. No abnormal bowel finding.  No diverticulosis or diverticulitis. Electronically Signed   By: Paulina Fusi M.D.   On: 03/12/2021 10:46    Procedures Procedures    Medications Ordered in ED Medications  HYDROmorphone (DILAUDID) injection 1 mg (1 mg Intravenous Given 03/12/21 0921)  sodium chloride 0.9 % bolus 1,000 mL (0 mLs Intravenous Stopped 03/12/21 1130)  ondansetron (ZOFRAN) injection 4 mg (4 mg Intravenous Given 03/12/21 0920)  iohexol (OMNIPAQUE) 300 MG/ML solution 100 mL (100 mLs Intravenous Contrast Given 03/12/21 1030)    ED Course  I have reviewed the triage vital signs and the nursing notes.  Pertinent labs & imaging results that were available during my care of the patient were reviewed by me and considered in my medical decision making (see chart for details).    MDM Rules/Calculators/A&P                           CT shows distal ureteral stone. This appears to be cause of her symptoms. She had a low grade fever yesterday, but none since.  She states she felt fatigued yesterday but no other symptoms.  Offered COVID/flu testing but she declines.  Unclear if this is related to the kidney stone but I favor it probably is not with a urinalysis that is benign and a normal WBC.  I did discuss she will need to closely monitor her temperature and if she does develop a fever needs to call her urologist and/or come back to the hospital.  Otherwise, she is chronically on oxycodone and would like to take ibuprofen at home for pain.  No indication for tamsulosin given size of stone.  We will have her follow-up with urology. Final Clinical Impression(s) / ED Diagnoses Final diagnoses:  Right ureteral stone    Rx / DC Orders ED Discharge Orders     None        Pricilla Loveless, MD 03/12/21 1142

## 2021-03-12 NOTE — ED Triage Notes (Signed)
Pt arrives to room ambulating but hunched over, crying reporting pain in lower abdomen starting this morning. States she had a fever last night and has had some nausea. States this feels like my kidneys or my uterus. Also reports history of kidney disease.

## 2021-06-06 LAB — HM HIV SCREENING LAB: HM HIV Screening: NEGATIVE

## 2021-09-07 ENCOUNTER — Emergency Department (HOSPITAL_BASED_OUTPATIENT_CLINIC_OR_DEPARTMENT_OTHER): Payer: Medicare Other

## 2021-09-07 ENCOUNTER — Encounter (HOSPITAL_BASED_OUTPATIENT_CLINIC_OR_DEPARTMENT_OTHER): Payer: Self-pay | Admitting: Emergency Medicine

## 2021-09-07 ENCOUNTER — Other Ambulatory Visit: Payer: Self-pay

## 2021-09-07 ENCOUNTER — Emergency Department (HOSPITAL_BASED_OUTPATIENT_CLINIC_OR_DEPARTMENT_OTHER)
Admission: EM | Admit: 2021-09-07 | Discharge: 2021-09-07 | Disposition: A | Discharge status: Home / Self Care | Payer: Medicare Other | Attending: Emergency Medicine | Admitting: Emergency Medicine

## 2021-09-07 DIAGNOSIS — W208XXA Other cause of strike by thrown, projected or falling object, initial encounter: Secondary | ICD-10-CM | POA: Insufficient documentation

## 2021-09-07 DIAGNOSIS — Z79899 Other long term (current) drug therapy: Secondary | ICD-10-CM | POA: Diagnosis not present

## 2021-09-07 DIAGNOSIS — S060X1A Concussion with loss of consciousness of 30 minutes or less, initial encounter: Secondary | ICD-10-CM | POA: Diagnosis not present

## 2021-09-07 DIAGNOSIS — S0990XA Unspecified injury of head, initial encounter: Secondary | ICD-10-CM | POA: Diagnosis present

## 2021-09-07 NOTE — ED Notes (Signed)
Patient transported to CT 

## 2021-09-07 NOTE — ED Triage Notes (Addendum)
Reports metal pipe fell on head , reports dizziness. Blurry vision , also reports toes numbness.  ?Headache , sleepier since yesterday , no anticoagulant intake ?Ambulated to room with steady gait  ?

## 2021-09-07 NOTE — ED Provider Notes (Signed)
?[facility] EMERGENCY DEPARTMENT ?Provider Note ? ? ?CSN: [id number] ?Arrival date & time: 09/07/21  0906 ? ?  ? ?History ? ?Chief Complaint  ?Patient presents with  ? Head Injury  ? ? ?Angelica Butler is a 56 y.o. female with a past medical history of arthritis presenting today.  She reports that on Wednesday she was doing work under her house when a metal pipe fell onto her head.  Says that she lost consciousness for "a second."  Over the last few days she has had headaches that get somewhat better with Tylenol and her tramadol however she wanted to make sure there was no bleeding in her head.  Also says that she has 1 numb toe on her left side, feels like she is yawning more than usual and has a full body sensation as if she "has a sunburn." ? ? ?Head Injury ? ?  ? ?Home Medications ?Prior to Admission medications   ?Medication Sig Start Date End Date Taking? Authorizing Provider  ?ALPRAZolam (XANAX) 1 MG tablet Take 1 mg by mouth at bedtime as needed for anxiety.    [provider]  ?Brexpiprazole 2 MG TABS Take 2 mg by mouth daily at 12 noon.    [provider]  ?FLUoxetine (PROZAC) 10 MG tablet Take 10 mg by mouth daily.    [provider]  ?HYDROcodone-acetaminophen (NORCO/VICODIN) 5-325 MG tablet Take 1 tablet by mouth every 6 (six) hours as needed. 06/04/18   Charlynne PanderYao, David Hsienta, MD  ?ibuprofen (ADVIL,MOTRIN) 600 MG tablet Take 1 tablet (600 mg total) by mouth every 6 (six) hours as needed. 06/04/18   Charlynne PanderYao, David Hsienta, MD  ?lisdexamfetamine (VYVANSE) 70 MG capsule Take 70 mg by mouth daily.    [provider]  ?metoCLOPramide (REGLAN) 10 MG tablet Take 1 tablet (10 mg total) by mouth every 6 (six) hours as needed for nausea (nausea/headache). 06/24/16   Rolan BuccoBelfi, Melanie, MD  ?metoprolol succinate (TOPROL-XL) 100 MG 24 hr tablet Take 100 mg by mouth daily. Take with or immediately following a meal.    [provider]  ?metroNIDAZOLE (FLAGYL) 500 MG tablet Take 1  tablet (500 mg total) by mouth 2 (two) times daily. One po bid x 7 days 06/04/18   Charlynne PanderYao, David Hsienta, MD  ?oxyCODONE-acetaminophen (PERCOCET) 10-325 MG tablet Take 1 tablet by mouth every 4 (four) hours as needed for pain.    [provider]  ?pantoprazole (PROTONIX) 20 MG tablet Take 1 tablet (20 mg total) by mouth daily. 06/24/16   Rolan BuccoBelfi, Melanie, MD  ?sucralfate (CARAFATE) 1 g tablet Take 1 tablet (1 g total) by mouth 4 (four) times daily -  with meals and at bedtime. 06/24/16   Rolan BuccoBelfi, Melanie, MD  ?SUMAtriptan Succinate (ZEMBRACE SYMTOUCH) 3 MG/0.5ML SOAJ Inject 3 mg into the skin as directed. 02/26/17   Drema DallasJaffe, Adam R, DO  ?tamsulosin (FLOMAX) 0.4 MG CAPS capsule Take 1 capsule (0.4 mg total) by mouth daily. 06/04/18   Charlynne PanderYao, David Hsienta, MD  ?tapentadol (NUCYNTA) 50 MG TABS tablet Take 200 mg by mouth.    [provider]  ?topiramate (TOPAMAX) 100 MG tablet Take 200 mg by mouth 2 (two) times daily.    [provider]  ?Topiramate ER (TROKENDI XR) 200 MG CP24 Take 200 mg by mouth at bedtime. 02/26/17   Drema DallasJaffe, Adam R, DO  ?Vitamin D, Ergocalciferol, (DRISDOL) 50000 UNITS CAPS capsule Take 50,000 Units by mouth every 7 (seven) days.    [provider]  ?   ? ?Allergies    ?Patient has no known allergies.   ? ?Review of Systems   ?Review of Systems ? ?Physical Exam ?Updated Vital Signs ?BP 137/89   Pulse 81   Temp 98.2 ?F (36.8 ?C) (Oral)   Resp 20   Ht 5\' 2"  (1.575 m)   Wt 68.5 kg   SpO2 100%   BMI 27.62 kg/m?  ?Physical Exam ?Vitals and nursing note reviewed.  ?Constitutional:   ?   Appearance: Normal appearance.  ?HENT:  ?   Head: Normocephalic and atraumatic.  ?   Comments: No lacerations, hematomas ?Eyes:  ?   General: No scleral icterus. ?   Extraocular Movements: Extraocular movements intact.  ?   Conjunctiva/sclera: Conjunctivae normal.  ?   Pupils: Pupils are equal, round, and reactive to light.  ?Pulmonary:  ?   Effort: Pulmonary effort is normal. No respiratory  distress.  ?Skin: ?   Findings: No rash.  ?Neurological:  ?   General: No focal deficit present.  ?   Mental Status: She is alert.  ?   Comments: Cranial nerves II through XII intact.  4 out of 5 strength in bilateral upper and lower extremities.  She reports that this is her baseline secondary to pain with arthritis.  EOMs intact and normal gait  ?Psychiatric:     ?   Mood and Affect: Mood normal.  ? ? ?ED Results / Procedures / Treatments   ?Labs ?(all labs ordered are listed, but only abnormal results are displayed) ?Labs Reviewed - No data to display ? ?EKG ?None ? ?Radiology ?CT Head Wo Contrast ? ?Result Date: 09/07/2021 ?CLINICAL DATA:  Dizziness and blurry vision after head injury. EXAM: CT HEAD WITHOUT CONTRAST TECHNIQUE: Contiguous axial images were obtained from the base of the skull through the vertex without intravenous contrast. RADIATION DOSE REDUCTION: This exam was performed according to the departmental dose-optimization program which includes automated exposure control, adjustment of the mA and/or kV according to patient size and/or use of iterative reconstruction technique. COMPARISON:  February 03, 2017. FINDINGS: Brain: No evidence of acute infarction, hemorrhage, hydrocephalus, extra-axial collection or mass lesion/mass effect. Vascular: No hyperdense vessel or unexpected calcification. Skull: Normal. Negative for fracture or focal lesion. Sinuses/Orbits: No acute finding. Other: None. IMPRESSION: No acute intracranial abnormality seen. Electronically Signed   By: Lupita Raider M.D.   On: 09/07/2021 09:31   ? ?Procedures ?Procedures  ? ?Medications Ordered in ED ?Medications - No data to display ? ?ED Course/ Medical Decision Making/ A&P ?  ?                        ?Medical Decision Making ?Amount and/or Complexity of Data Reviewed ?Radiology: ordered. ? ? ?56 year old female presenting after head injury.  CT ordered per patient request.  This was independently interpreted and viewed by me and  there were no signs of hemorrhage or hematoma.  No skull fractures.  Negative CT. ? ?Patient reports she just wanted to know that she was not bleeding on her brain and that she can tolerate the headaches at home with her Tylenol and tramadol.  We discussed the varying duration of concussions however she will follow-up with her PCP as needed. ? ? ?Final Clinical Impression(s) / ED Diagnoses ?Final diagnoses:  ?Concussion with loss of consciousness of 30 minutes or less, initial encounter  ? ? ?Rx / DC Orders ? ?Results and diagnoses were explained to  the patient. Return precautions discussed in full. Patient had no additional questions and expressed complete understanding. ? ? ?This chart was dictated using voice recognition software.  Despite best efforts to proofread,  errors can occur which can change the documentation meaning.  ? ?  ?Shagun Wordell A, PA-C ?09/07/21 1002 ? ?  ?Sloan Leiter, DO ?09/08/21 1221 ? ?

## 2021-09-07 NOTE — Discharge Instructions (Addendum)
Read the information about concussions attached to these discharge papers.  Continue with your Tylenol and tramadol.  Follow-up with your primary care physician as needed ?

## 2021-12-31 ENCOUNTER — Other Ambulatory Visit: Payer: Self-pay | Admitting: Obstetrics and Gynecology

## 2021-12-31 DIAGNOSIS — N951 Menopausal and female climacteric states: Secondary | ICD-10-CM

## 2022-01-22 ENCOUNTER — Other Ambulatory Visit: Payer: Self-pay | Admitting: Obstetrics and Gynecology

## 2022-07-01 ENCOUNTER — Telehealth: Payer: Self-pay | Admitting: Family Medicine

## 2022-07-01 ENCOUNTER — Encounter: Payer: Self-pay | Admitting: Nurse Practitioner

## 2022-07-01 NOTE — Telephone Encounter (Signed)
Caller Name: Mikaylee Call back phone #: [phone]  Reason for Call: Pt has been without her diabetes med mounjaro for several days can you refill before her appt on 3/13

## 2022-07-04 ENCOUNTER — Ambulatory Visit (INDEPENDENT_AMBULATORY_CARE_PROVIDER_SITE_OTHER): Payer: Medicare Other | Admitting: Nurse Practitioner

## 2022-07-04 ENCOUNTER — Encounter: Payer: Self-pay | Admitting: Nurse Practitioner

## 2022-07-04 VITALS — BP 134/90 | HR 78 | Temp 98.0°F | Ht 62.0 in | Wt 136.0 lb

## 2022-07-04 DIAGNOSIS — R7303 Prediabetes: Secondary | ICD-10-CM | POA: Diagnosis not present

## 2022-07-04 DIAGNOSIS — M25511 Pain in right shoulder: Secondary | ICD-10-CM | POA: Diagnosis not present

## 2022-07-04 DIAGNOSIS — G43009 Migraine without aura, not intractable, without status migrainosus: Secondary | ICD-10-CM

## 2022-07-04 DIAGNOSIS — M1990 Unspecified osteoarthritis, unspecified site: Secondary | ICD-10-CM

## 2022-07-04 DIAGNOSIS — E7800 Pure hypercholesterolemia, unspecified: Secondary | ICD-10-CM | POA: Diagnosis not present

## 2022-07-04 DIAGNOSIS — E119 Type 2 diabetes mellitus without complications: Secondary | ICD-10-CM

## 2022-07-04 DIAGNOSIS — F419 Anxiety disorder, unspecified: Secondary | ICD-10-CM

## 2022-07-04 MED ORDER — TIRZEPATIDE 2.5 MG/0.5ML ~~LOC~~ SOAJ: 2.5000 mg | SUBCUTANEOUS | 0 refills | Status: DC

## 2022-07-04 NOTE — Progress Notes (Signed)
New Patient Visit  BP (!) 134/90   Pulse 78   Temp 98 F (36.7 C)   Ht '5\' 2"'$  (1.575 m)   Wt 136 lb (61.7 kg)   SpO2 99%   BMI 24.87 kg/m    Subjective:    Patient ID: Angelica Butler, female    DOB: 14-Jun-1965, 57 y.o.   MRN: [id number]  CC: Chief Complaint  Patient presents with   Establish Care    NP. Est. Care, concerns with diabetic medication refills, right shoulder pain    HPI: Angelica Butler is a 57 y.o. female presents for new patient visit to establish care.  Introduced to Designer, jewellery role and practice setting.  All questions answered.  Discussed provider/patient relationship and expectations.  She has a history of pain in her right shoulder that has been going on for the last 2 months.  It started after she moved in January and has been hurting since lifting boxes.  She describes the pain as moderate.  The pain is to the top of her shoulder and will sometimes radiate down her arm.  She does have pain with range of motion.  She would like an x-ray since this is ongoing.  She has a history of arthritis and is following with pain management.  She is currently taking tramadol extended release 200 mg daily.  She has arthritis in her fingers, knees, back.  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  She has a history of hyperlipidemia.  She is not currently taking any medication for this.  She states that it has gotten better with exercise and weight loss.  She has a history of prediabetes and a significant family history of diabetes.  She had tried metformin, however she had side effects and could not tolerate this medication.  She was on Ozempic for a year and her fasting sugars were still elevated.  She was switched to [facility] and states that this was helping control her blood sugars.  She has been taking this since 12/2021 and she had gone up to the 12.5 mg injection weekly.  She needs a refill on this medication today.  Pain right shoulder. She  moved in January and it has been hurting since then. She is having moderate pain. Top of right shoulder  She has a history of anxiety and is taking fluoxetine 10 mg daily and Xanax 1 mg daily at bedtime as needed.  She states that her anxiety has been slightly worse recently with worrying about her medication and getting it refilled.  Overall she feels like her mood is pretty stable.     07/04/2022   10:57 AM  Depression screen PHQ 2/9  Decreased Interest 1  Down, Depressed, Hopeless 1  PHQ - 2 Score 2  Altered sleeping 1  Tired, decreased energy 1  Change in appetite 0  Feeling bad or failure about yourself  1  Trouble concentrating 1  Suicidal thoughts 0  PHQ-9 Score 6  Difficult doing work/chores Not difficult at all      07/04/2022   10:58 AM  GAD 7 : Generalized Anxiety Score  Nervous, Anxious, on Edge 2  Control/stop worrying 3  Worry too much - different things 1  Trouble relaxing 1  Restless 1  Easily annoyed or irritable 0  Afraid - awful might happen 3  Total GAD 7 Score 11  Anxiety Difficulty Not difficult at all    Past Medical History:  Diagnosis Date   Anxiety 2016   Separation   Depression    Hyperlipidemia    Hypertension 2004   While pregnant with first child. He's under your care for HBP   Knee pain, chronic    Migraine    Neuromuscular disorder ([place]) 2005   Arthritis and Chiari malformation   Renal disorder    cysts on kidneys   Rheumatoid arthritis [facility])     Past Surgical History:  Procedure Laterality Date   CESAREAN SECTION     ENDOMETRIAL ABLATION     RADIOFREQUENCY ABLATION NERVES     TUBAL LIGATION  June 2006    Family History  Problem Relation Age of Onset   Breast cancer Maternal Aunt    Breast cancer Paternal Grandmother    Diabetes Paternal Grandmother    Heart disease Paternal Grandmother    Diabetes Mother    Hypertension Mother    Kidney disease Mother    Obesity Mother    Stroke Mother    Cancer Father    Diabetes  Father    Early death Father    Hearing loss Father    Hypertension Father    Hypertension Maternal Grandfather    Arthritis Maternal Grandmother    Cancer Maternal Grandmother    Miscarriages / Stillbirths Maternal Grandmother    Varicose Veins Maternal Grandmother    Hypertension Paternal Grandfather    Diabetes Maternal Uncle    Diabetes Brother    Hypertension Brother    Obesity Brother    Diabetes Brother    Hypertension Brother    Obesity Brother    Diabetes Maternal Uncle    Hypertension Maternal Uncle    Obesity Maternal Uncle    Diabetes Maternal Uncle    Hypertension Maternal Uncle    Hypertension Son      Social History   Tobacco Use   Smoking status: Never   Smokeless tobacco: Never  Vaping Use   Vaping Use: Never used  Substance Use Topics   Alcohol use: No   Drug use: No    Current Outpatient Medications on File Prior to Visit  Medication Sig Dispense Refill   ALPRAZolam (XANAX) 1 MG tablet Take 1 mg by mouth at bedtime as needed for anxiety.     FLUoxetine (PROZAC) 10 MG tablet Take 10 mg by mouth daily.     ibuprofen (ADVIL,MOTRIN) 600 MG tablet Take 1 tablet (600 mg total) by mouth every 6 (six) hours as needed. 30 tablet 0   ondansetron (ZOFRAN) 4 MG tablet Take 4 mg by mouth every 8 (eight) hours as needed for nausea or vomiting.     SUMAtriptan Succinate (ZEMBRACE SYMTOUCH) 3 MG/0.5ML SOAJ Inject 3 mg into the skin as directed. 2 pen 0   Topiramate ER (TROKENDI XR) 200 MG CP24 Take 200 mg by mouth at bedtime. 14 capsule 0   Vitamin D, Ergocalciferol, (DRISDOL) 50000 UNITS CAPS capsule Take 50,000 Units by mouth every 7 (seven) days.     traMADol (ULTRAM-ER) 200 MG 24 hr tablet Take 200 mg by mouth daily.     No current facility-administered medications on file prior to visit.     Review of Systems  Constitutional:  Positive for fatigue. Negative for fever.  HENT: Negative.    Eyes: Negative.   Respiratory: Negative.    Cardiovascular:  Negative.   Gastrointestinal: Negative.   Genitourinary: Negative.   Musculoskeletal:  Positive for arthralgias.  Skin: Negative.   Neurological: Negative.   Psychiatric/Behavioral:  Negative for dysphoric mood. The patient is nervous/anxious.       Objective:    BP (!) 134/90   Pulse 78   Temp 98 F (36.7 C)   Ht '5\' 2"'$  (1.575 m)   Wt 136 lb (61.7 kg)   SpO2 99%   BMI 24.87 kg/m   Wt Readings from Last 3 Encounters:  07/04/22 136 lb (61.7 kg)  09/07/21 151 lb (68.5 kg)  03/12/21 154 lb (69.9 kg)    BP Readings from Last 3 Encounters:  07/04/22 (!) 134/90  09/07/21 137/89  03/12/21 (!) 125/91    Physical Exam Vitals and nursing note reviewed.  Constitutional:      General: She is not in acute distress.    Appearance: Normal appearance.  HENT:     Head: Normocephalic and atraumatic.     Right Ear: Tympanic membrane, ear canal and external ear normal.     Left Ear: Tympanic membrane, ear canal and external ear normal.  Eyes:     Conjunctiva/sclera: Conjunctivae normal.  Cardiovascular:     Rate and Rhythm: Normal rate and regular rhythm.     Pulses: Normal pulses.     Heart sounds: Normal heart sounds.  Pulmonary:     Effort: Pulmonary effort is normal.     Breath sounds: Normal breath sounds.  Abdominal:     Palpations: Abdomen is soft.     Tenderness: There is no abdominal tenderness.  Musculoskeletal:        General: No tenderness.     Cervical back: Normal range of motion and neck supple.     Right lower leg: No edema.     Left lower leg: No edema.     Comments: Limited ROM in right shoulder due to pain.  Weak right arm with empty can test  Lymphadenopathy:     Cervical: No cervical adenopathy.  Skin:    General: Skin is warm and dry.  Neurological:     General: No focal deficit present.     Mental Status: She is alert and oriented to person, place, and time.     Cranial Nerves: No cranial nerve deficit.     Coordination: Coordination normal.      Gait: Gait normal.  Psychiatric:        Mood and Affect: Mood normal.        Behavior: Behavior normal.        Thought Content: Thought content normal.        Judgment: Judgment normal.        Assessment & Plan:   Problem List Items Addressed This Visit       Cardiovascular and Mediastinum   Migraine without aura and without status migrainosus, not intractable    Chronic, stable.  She takes Topamax ER 200 mg daily at bedtime for preventative and sumatriptan injection as needed for acute migraine.  Will continue this regimen.  She does not need refills today.      Relevant Medications   traMADol (ULTRAM-ER) 200 MG 24 hr tablet     Musculoskeletal and Integument   Arthritis    Chronic, stable.  She is currently following with pain management.  She is taking tramadol ER 200 mg daily.  Continue collaboration recommendation from specialist.      Relevant Medications   traMADol (ULTRAM-ER) 200 MG 24 hr tablet     Other   Acute pain of right shoulder - Primary    She has been experiencing pain to her  right shoulder for 2 months after lifting heavy boxes while moving.  She does have limited range of motion in her right shoulder due to pain along with weakness with empty can test.  Will get a x-ray of her right shoulder.  Recommend that she continue stretching daily along with using ice/heat.  She may need an MRI eventually.      Relevant Orders   DG Shoulder Right   Prediabetes    She was diagnosed with prediabetes in 2021.  She was taking metformin at first, however she did not tolerate this medication with side effects.  She was then placed on Ozempic on 12/07/2019 and took this for a year, however her blood sugars were still of elevated on this.  She was then switched to [facility] in 12/2021 and this was helping control her blood sugars.  She states that she was up to the 12.5 mg weekly injection dose.  She has been out of this medication for about 6 weeks now.  Her most recent A1c was  5.0.  Her fasting glucose was 102 and her LDL was 178 on 06/20/2022.  Will restart her on Mounjaro, since she has been not taking this medication for the last few weeks we will start at the low dose again and work her way back up.  She also has a significant family history of diabetes with complications.      Pure hypercholesterolemia    Chronic, stable.  Continue with exercise and weight loss.  The 10-year ASCVD risk score (Arnett DK, et al., 2019) is: 2%   Values used to calculate the score:     Age: 41 years     Sex: Female     Is Non-Hispanic African American: No     Diabetic: No     Tobacco smoker: No     Systolic Blood Pressure: Q000111Q mmHg     Is BP treated: No     HDL Cholesterol: 73 MG/DL     Total Cholesterol: 262 MG/DL       Anxiety    Chronic, stable.  Continue fluoxetine 10 mg daily and Xanax 1 mg daily at bedtime as needed.        Follow up plan: Return in about 3 months (around 10/02/2022) for prediabetes, shoulder pain.

## 2022-07-04 NOTE — Assessment & Plan Note (Signed)
Chronic, stable.  Continue with exercise and weight loss.  The 10-year ASCVD risk score (Arnett DK, et al., 2019) is: 2%   Values used to calculate the score:     Age: 57 years     Sex: Female     Is Non-Hispanic African American: No     Diabetic: No     Tobacco smoker: No     Systolic Blood Pressure: Q000111Q mmHg     Is BP treated: No     HDL Cholesterol: 73 MG/DL     Total Cholesterol: 262 MG/DL

## 2022-07-04 NOTE — Assessment & Plan Note (Signed)
Chronic, stable.  Continue fluoxetine 10 mg daily and Xanax 1 mg daily at bedtime as needed.

## 2022-07-04 NOTE — Assessment & Plan Note (Signed)
Chronic, stable.  She is currently following with pain management.  She is taking tramadol ER 200 mg daily.  Continue collaboration recommendation from specialist.

## 2022-07-04 NOTE — Assessment & Plan Note (Signed)
She has been experiencing pain to her right shoulder for 2 months after lifting heavy boxes while moving.  She does have limited range of motion in her right shoulder due to pain along with weakness with empty can test.  Will get a x-ray of her right shoulder.  Recommend that she continue stretching daily along with using ice/heat.  She may need an MRI eventually.

## 2022-07-04 NOTE — Patient Instructions (Signed)
It was great to see you!  I have ordered an x-ray of your shoulder. Start alternating ice/heat. Do gentle stretches. We may need to do an MRI.   I have refilled your mounjaro, we will be in touch with the prior authorization/appeal if needed.   Let's follow-up in 2-3 months, sooner if you have concerns.  If a referral was placed today, you will be contacted for an appointment. Please note that routine referrals can sometimes take up to 3-4 weeks to process. Please call our office if you haven't heard anything after this time frame.  Take care,  Vance Peper, NP

## 2022-07-04 NOTE — Assessment & Plan Note (Signed)
Chronic, stable.  She takes Topamax ER 200 mg daily at bedtime for preventative and sumatriptan injection as needed for acute migraine.  Will continue this regimen.  She does not need refills today.

## 2022-07-04 NOTE — Assessment & Plan Note (Signed)
She was diagnosed with prediabetes in 2021.  She was taking metformin at first, however she did not tolerate this medication with side effects.  She was then placed on Ozempic on 12/07/2019 and took this for a year, however her blood sugars were still of elevated on this.  She was then switched to [facility] in 12/2021 and this was helping control her blood sugars.  She states that she was up to the 12.5 mg weekly injection dose.  She has been out of this medication for about 6 weeks now.  Her most recent A1c was 5.0.  Her fasting glucose was 102 and her LDL was 178 on 06/20/2022.  Will restart her on Mounjaro, since she has been not taking this medication for the last few weeks we will start at the low dose again and work her way back up.  She also has a significant family history of diabetes with complications.

## 2022-07-08 ENCOUNTER — Inpatient Hospital Stay: Admission: RE | Admit: 2022-07-08 | Payer: Medicare Other | Source: Ambulatory Visit

## 2022-07-12 ENCOUNTER — Encounter: Payer: Self-pay | Admitting: Nurse Practitioner

## 2022-07-12 DIAGNOSIS — E119 Type 2 diabetes mellitus without complications: Secondary | ICD-10-CM | POA: Insufficient documentation

## 2022-07-12 MED ORDER — TIRZEPATIDE 5 MG/0.5ML ~~LOC~~ SOAJ
5.0000 mg | SUBCUTANEOUS | 0 refills | Status: DC
Start: 2022-07-12 — End: 2022-09-16

## 2022-07-17 ENCOUNTER — Ambulatory Visit: Payer: Medicare Other | Admitting: Nurse Practitioner

## 2022-09-03 ENCOUNTER — Other Ambulatory Visit: Payer: Self-pay | Admitting: Obstetrics

## 2022-09-03 DIAGNOSIS — N6320 Unspecified lump in the left breast, unspecified quadrant: Secondary | ICD-10-CM

## 2022-09-16 ENCOUNTER — Ambulatory Visit (INDEPENDENT_AMBULATORY_CARE_PROVIDER_SITE_OTHER): Payer: Medicare Other | Admitting: Nurse Practitioner

## 2022-09-16 ENCOUNTER — Encounter: Payer: Self-pay | Admitting: Nurse Practitioner

## 2022-09-16 VITALS — BP 120/84 | HR 70 | Temp 97.3°F | Ht 62.0 in | Wt 125.0 lb

## 2022-09-16 DIAGNOSIS — L989 Disorder of the skin and subcutaneous tissue, unspecified: Secondary | ICD-10-CM | POA: Diagnosis not present

## 2022-09-16 NOTE — Progress Notes (Signed)
   Acute Office Visit  Subjective:     Patient ID: Angelica Butler, female    DOB: 1966-01-29, 57 y.o.   MRN: [id number]  Chief Complaint  Patient presents with   Skin Problem    Mole on lower abdomen-no pain    HPI Patient is in today for new mole that showed up 2 months ago to her left lower pelvis. She states that it is changing colors. It was dark, then gray, now it's red. She denies itching and pain. She has not seen a dermatologist. She has a history of squamous cell carcinoma and is concerned it could be skin cancer.   ROS See pertinent positives and negatives per HPI.     Objective:    BP 120/84 (BP Location: Right Arm)   Pulse 70   Temp (!) 97.3 F (36.3 C)   Ht 5\' 2"  (1.575 m)   Wt 125 lb (56.7 kg)   SpO2 99%   BMI 22.86 kg/m  BP Readings from Last 3 Encounters:  09/16/22 120/84  07/04/22 (!) 134/90  09/07/21 137/89   Wt Readings from Last 3 Encounters:  09/16/22 125 lb (56.7 kg)  07/04/22 136 lb (61.7 kg)  09/07/21 151 lb (68.5 kg)      Physical Exam Vitals and nursing note reviewed.  Constitutional:      General: She is not in acute distress.    Appearance: Normal appearance.  HENT:     Head: Normocephalic.  Eyes:     Conjunctiva/sclera: Conjunctivae normal.  Pulmonary:     Effort: Pulmonary effort is normal.  Musculoskeletal:     Cervical back: Normal range of motion.  Skin:    General: Skin is warm.     Findings: Lesion (0.5 x 0.2 cm pink/pearly skin lesion to left lower pelvis) present.  Neurological:     General: No focal deficit present.     Mental Status: She is alert and oriented to person, place, and time.  Psychiatric:        Mood and Affect: Mood normal.        Behavior: Behavior normal.        Thought Content: Thought content normal.        Judgment: Judgment normal.       Assessment & Plan:   Problem List Items Addressed This Visit   None Visit Diagnoses     Skin lesion    -  Primary   ~0.5 x 0.2cm pink/pearly skin  lesion to left lower pelvis. Will place referral to dermatology for further evaluation with history of squamous cell carcinoma.   Relevant Orders   Ambulatory referral to Dermatology       No orders of the defined types were placed in this encounter.   Return in about 4 weeks (around 10/14/2022) for 1-2 months , CPE.  Gerre Scull, NP

## 2022-09-16 NOTE — Patient Instructions (Signed)
It was great to see you!  [facility] [address] [place], [place] [address] Phone: [phone]  Let's follow-up in 1-2 months, sooner if you have concerns.  If a referral was placed today, you will be contacted for an appointment. Please note that routine referrals can sometimes take up to 3-4 weeks to process. Please call our office if you haven't heard anything after this time frame.  Take care,  Rodman Pickle, NP

## 2022-09-17 ENCOUNTER — Other Ambulatory Visit: Payer: Medicare Other

## 2022-09-19 ENCOUNTER — Telehealth: Payer: Self-pay

## 2022-09-19 MED ORDER — MOUNJARO 7.5 MG/0.5ML ~~LOC~~ SOAJ: 7.5000 mg | SUBCUTANEOUS | 1 refills | Status: DC

## 2022-09-19 NOTE — Telephone Encounter (Signed)
LVM that Rx sent to pharmacy. 

## 2022-09-19 NOTE — Telephone Encounter (Signed)
Patient said that she was seen on 09-15-24 and her Rx of Mounjaro 7.5mg  was not sent to pharmacy.    preferred pharmacy [facility]

## 2022-10-14 ENCOUNTER — Ambulatory Visit: Payer: Medicare Other | Admitting: Nurse Practitioner

## 2022-10-29 ENCOUNTER — Ambulatory Visit: Payer: Medicare Other | Admitting: Dermatology

## 2022-11-04 ENCOUNTER — Encounter: Payer: Self-pay | Admitting: Nurse Practitioner

## 2022-11-14 ENCOUNTER — Encounter: Payer: Medicare Other | Admitting: Nurse Practitioner

## 2022-11-14 ENCOUNTER — Telehealth: Payer: Self-pay | Admitting: Nurse Practitioner

## 2022-11-14 NOTE — Telephone Encounter (Signed)
Pt was a no show for a cpe with Lauren on 11/14/22, I sent a letter.

## 2022-11-27 NOTE — Telephone Encounter (Signed)
You are correct. 10/14/2022 same day cancellation. I will send final warning letter via mail and mychart & generate charge.

## 2022-11-27 NOTE — Telephone Encounter (Signed)
Noted  

## 2022-11-27 NOTE — Telephone Encounter (Signed)
1st no show, fee waived ?

## 2022-12-24 ENCOUNTER — Inpatient Hospital Stay: Admission: RE | Admit: 2022-12-24 | Payer: Medicare Other | Source: Ambulatory Visit

## 2022-12-31 ENCOUNTER — Ambulatory Visit (INDEPENDENT_AMBULATORY_CARE_PROVIDER_SITE_OTHER): Payer: Medicare Other | Admitting: Nurse Practitioner

## 2022-12-31 VITALS — BP 118/82 | HR 70 | Temp 97.5°F | Ht 62.0 in | Wt 120.0 lb

## 2022-12-31 DIAGNOSIS — Z7985 Long-term (current) use of injectable non-insulin antidiabetic drugs: Secondary | ICD-10-CM | POA: Diagnosis not present

## 2022-12-31 DIAGNOSIS — G43009 Migraine without aura, not intractable, without status migrainosus: Secondary | ICD-10-CM | POA: Diagnosis not present

## 2022-12-31 DIAGNOSIS — E7800 Pure hypercholesterolemia, unspecified: Secondary | ICD-10-CM

## 2022-12-31 DIAGNOSIS — E119 Type 2 diabetes mellitus without complications: Secondary | ICD-10-CM

## 2022-12-31 DIAGNOSIS — M25511 Pain in right shoulder: Secondary | ICD-10-CM | POA: Diagnosis not present

## 2022-12-31 DIAGNOSIS — Z0001 Encounter for general adult medical examination with abnormal findings: Secondary | ICD-10-CM

## 2022-12-31 DIAGNOSIS — E559 Vitamin D deficiency, unspecified: Secondary | ICD-10-CM | POA: Diagnosis not present

## 2022-12-31 DIAGNOSIS — Z0000 Encounter for general adult medical examination without abnormal findings: Secondary | ICD-10-CM | POA: Insufficient documentation

## 2022-12-31 DIAGNOSIS — F419 Anxiety disorder, unspecified: Secondary | ICD-10-CM

## 2022-12-31 LAB — COMPREHENSIVE METABOLIC PANEL
ALT: 9 U/L (ref 0–35)
AST: 14 U/L (ref 0–37)
Albumin: 4.5 g/dL (ref 3.5–5.2)
Alkaline Phosphatase: 47 U/L (ref 39–117)
BUN: 13 mg/dL (ref 6–23)
CO2: 27 mEq/L (ref 19–32)
Calcium: 9.6 mg/dL (ref 8.4–10.5)
Chloride: 106 mEq/L (ref 96–112)
Creatinine, Ser: 0.81 mg/dL (ref 0.40–1.20)
GFR: 81.93 mL/min (ref >60.00)
Glucose, Bld: 70 mg/dL (ref 70–99)
Potassium: 4.1 mEq/L (ref 3.5–5.1)
Sodium: 143 mEq/L (ref 135–145)
Total Bilirubin: 0.3 mg/dL (ref 0.2–1.2)
Total Protein: 6.6 g/dL (ref 6.0–8.3)

## 2022-12-31 LAB — MICROALBUMIN / CREATININE URINE RATIO
Creatinine,U: 128 mg/dL
Microalb Creat Ratio: 1.1 mg/g (ref 0.0–30.0)
Microalb, Ur: 1.4 mg/dL (ref 0.0–1.9)

## 2022-12-31 LAB — LIPID PANEL
Cholesterol: 281 mg/dL — ABNORMAL HIGH (ref 0–200)
HDL: 63.9 mg/dL (ref >39.00)
LDL Cholesterol: 197 mg/dL — ABNORMAL HIGH (ref 0–99)
NonHDL: 216.99
Total CHOL/HDL Ratio: 4
Triglycerides: 100 mg/dL (ref 0.0–149.0)
VLDL: 20 mg/dL (ref 0.0–40.0)

## 2022-12-31 LAB — CBC WITH DIFFERENTIAL/PLATELET
Basophils Absolute: 0 10*3/uL (ref 0.0–0.1)
Basophils Relative: 0.8 % (ref 0.0–3.0)
Eosinophils Absolute: 0.1 10*3/uL (ref 0.0–0.7)
Eosinophils Relative: 1.7 % (ref 0.0–5.0)
HCT: 42.1 % (ref 36.0–46.0)
Hemoglobin: 13.5 g/dL (ref 12.0–15.0)
Lymphocytes Relative: 39.3 % (ref 12.0–46.0)
Lymphs Abs: 2 10*3/uL (ref 0.7–4.0)
MCHC: 32 g/dL (ref 30.0–36.0)
MCV: 90.9 fl (ref 78.0–100.0)
Monocytes Absolute: 0.2 10*3/uL (ref 0.1–1.0)
Monocytes Relative: 3.7 % (ref 3.0–12.0)
Neutro Abs: 2.8 10*3/uL (ref 1.4–7.7)
Neutrophils Relative %: 54.5 % (ref 43.0–77.0)
Platelets: 245 10*3/uL (ref 150.0–400.0)
RBC: 4.64 Mil/uL (ref 3.87–5.11)
RDW: 13.1 % (ref 11.5–15.5)
WBC: 5.1 10*3/uL (ref 4.0–10.5)

## 2022-12-31 LAB — VITAMIN D 25 HYDROXY (VIT D DEFICIENCY, FRACTURES): VITD: 76.13 ng/mL (ref 30.00–100.00)

## 2022-12-31 LAB — HEMOGLOBIN A1C: Hgb A1c MFr Bld: 5.1 % (ref 4.6–6.5)

## 2022-12-31 MED ORDER — MOUNJARO 7.5 MG/0.5ML ~~LOC~~ SOAJ: 7.5000 mg | SUBCUTANEOUS | 1 refills | Status: DC

## 2022-12-31 NOTE — Assessment & Plan Note (Addendum)
Chronic, stable.  Continue with exercise and weight loss.  Check lipid panel today  The 10-year ASCVD risk score (Arnett DK, et al., 2019) is: 3.3%   Values used to calculate the score:     Age: 57 years     Sex: Female     Is Non-Hispanic African American: No     Diabetic: Yes     Tobacco smoker: No     Systolic Blood Pressure: 118 mmHg     Is BP treated: No     HDL Cholesterol: 73 MG/DL     Total Cholesterol: 262 MG/DL

## 2022-12-31 NOTE — Progress Notes (Signed)
BP 118/82 (BP Location: Left Arm)   Pulse 70   Temp (!) 97.5 F (36.4 C)   Ht 5\' 2"  (1.575 m)   Wt 120 lb (54.4 kg)   SpO2 98%   BMI 21.95 kg/m    Subjective:    Patient ID: Angelica Butler, female    DOB: 03-04-1966, 57 y.o.   MRN: [id number]  CC: Chief Complaint  Patient presents with   Annual Exam    With fasting lab work, no concerns    HPI: Angelica Butler is a 57 y.o. female presenting on 12/31/2022 for comprehensive medical examination. Current medical complaints include:none  Depression and Anxiety Screen done today and results listed below:     12/31/2022   11:36 AM 07/04/2022   10:57 AM  Depression screen PHQ 2/9  Decreased Interest 1 1  Down, Depressed, Hopeless 1 1  PHQ - 2 Score 2 2  Altered sleeping 0 1  Tired, decreased energy 1 1  Change in appetite 0 0  Feeling bad or failure about yourself  1 1  Trouble concentrating 1 1  Moving slowly or fidgety/restless 1   Suicidal thoughts 0 0  PHQ-9 Score 6 6  Difficult doing work/chores Not difficult at all Not difficult at all      12/31/2022   11:37 AM 07/04/2022   10:58 AM  GAD 7 : Generalized Anxiety Score  Nervous, Anxious, on Edge 1 2  Control/stop worrying 1 3  Worry too much - different things 1 1  Trouble relaxing 1 1  Restless 0 1  Easily annoyed or irritable 1 0  Afraid - awful might happen 0 3  Total GAD 7 Score 5 11  Anxiety Difficulty Not difficult at all Not difficult at all    The patient does not have a history of falls. I did not complete a risk assessment for falls. A plan of care for falls was not documented.   Past Medical History:  Past Medical History:  Diagnosis Date   Anxiety 2016   Separation   Depression    Hyperlipidemia    Hypertension 2004   While pregnant with first child. He's under your care for HBP   Knee pain, chronic    Migraine    Neuromuscular disorder (HCC) 2005   Arthritis and Chiari malformation   Renal disorder    cysts on kidneys   Rheumatoid  arthritis (HCC)    Type 2 diabetes mellitus (HCC)     Surgical History:  Past Surgical History:  Procedure Laterality Date   CESAREAN SECTION     ENDOMETRIAL ABLATION     RADIOFREQUENCY ABLATION NERVES     TUBAL LIGATION  June 2006    Medications:  Current Outpatient Medications on File Prior to Visit  Medication Sig   ALPRAZolam (XANAX) 1 MG tablet Take 1 mg by mouth at bedtime as needed for anxiety.   cholecalciferol (VITAMIN D3) 25 MCG (1000 UNIT) tablet Take 5,000 Units by mouth daily.   Estradiol-Norethindrone Acet 0.5-0.1 MG tablet Take 1 tablet by mouth daily.   FLUoxetine (PROZAC) 10 MG tablet Take 10 mg by mouth daily.   fluticasone (FLONASE) 50 MCG/ACT nasal spray Place 1 spray into both nostrils as needed.   ibuprofen (ADVIL,MOTRIN) 600 MG tablet Take 1 tablet (600 mg total) by mouth every 6 (six) hours as needed.   lamoTRIgine (LAMICTAL) 200 MG tablet Take 200 mg by mouth 2 (two) times daily.   ondansetron (ZOFRAN) 4 MG tablet  Take 4 mg by mouth every 8 (eight) hours as needed for nausea or vomiting.   SUMAtriptan Succinate (ZEMBRACE SYMTOUCH) 3 MG/0.5ML SOAJ Inject 3 mg into the skin as directed.   Topiramate ER (TROKENDI XR) 200 MG CP24 Take 200 mg by mouth at bedtime.   traMADol (ULTRAM-ER) 200 MG 24 hr tablet Take 200 mg by mouth daily.   amphetamine-dextroamphetamine (ADDERALL) 30 MG tablet Take 1 tablet by mouth daily. (Patient not taking: Reported on 12/31/2022)   lisdexamfetamine (VYVANSE) 70 MG capsule Take 70 mg by mouth daily. (Patient not taking: Reported on 12/31/2022)   No current facility-administered medications on file prior to visit.    Allergies:  Allergies  Allergen Reactions   Other Other (See Comments)    Cat Dander-cause itchy eyes    Social History:  Social History   Socioeconomic History   Marital status: Legally Separated    Spouse name: Not on file   Number of children: Not on file   Years of education: Not on file   Highest  education level: Associate degree: occupational, Scientist, product/process development, or vocational program  Occupational History   Not on file  Tobacco Use   Smoking status: Never   Smokeless tobacco: Never  Vaping Use   Vaping status: Never Used  Substance and Sexual Activity   Alcohol use: No   Drug use: No   Sexual activity: Not Currently    Birth control/protection: Post-menopausal  Other Topics Concern   Not on file  Social History Narrative   Not on file   Social Determinants of Health   Financial Resource Strain: Medium Risk (12/31/2022)   Overall Financial Resource Strain (CARDIA)    Difficulty of Paying Living Expenses: Somewhat hard  Food Insecurity: Food Insecurity Present (12/31/2022)   Hunger Vital Sign    Worried About Running Out of Food in the Last Year: Sometimes true    Ran Out of Food in the Last Year: Never true  Transportation Needs: No Transportation Needs (12/31/2022)   PRAPARE - Administrator, Civil Service (Medical): No    Lack of Transportation (Non-Medical): No  Physical Activity: Insufficiently Active (12/31/2022)   Exercise Vital Sign    Days of Exercise per Week: 2 days    Minutes of Exercise per Session: 20 min  Stress: Stress Concern Present (12/31/2022)   [employer] of Occupational Health - Occupational Stress Questionnaire    Feeling of Stress : To some extent  Social Connections: Socially Isolated (12/31/2022)   Social Connection and Isolation Panel [NHANES]    Frequency of Communication with Friends and Family: More than three times a week    Frequency of Social Gatherings with Friends and Family: Once a week    Attends Religious Services: Never    Database administrator or Organizations: No    Attends Engineer, structural: Not on file    Marital Status: Separated  Intimate Partner Violence: Unknown (08/08/2021)   Received from [employer]   HITS    Physically Hurt: Not on file    Insult or Talk Down To: Not on file     Threaten Physical Harm: Not on file    Scream or Curse: Not on file   Social History   Tobacco Use  Smoking Status Never  Smokeless Tobacco Never   Social History   Substance and Sexual Activity  Alcohol Use No    Family History:  Family History  Problem Relation Age of Onset  Breast cancer Maternal Aunt    Breast cancer Paternal Grandmother    Diabetes Paternal Grandmother    Heart disease Paternal Grandmother    Diabetes Mother    Hypertension Mother    Kidney disease Mother    Obesity Mother    Stroke Mother    Cancer Father    Diabetes Father    Early death Father    Hearing loss Father    Hypertension Father    Hypertension Maternal Grandfather    Arthritis Maternal Grandmother    Cancer Maternal Grandmother    Miscarriages / Stillbirths Maternal Grandmother    Varicose Veins Maternal Grandmother    Hypertension Paternal Grandfather    Diabetes Maternal Uncle    Diabetes Brother    Hypertension Brother    Obesity Brother    Diabetes Brother    Hypertension Brother    Obesity Brother    Diabetes Maternal Uncle    Hypertension Maternal Uncle    Obesity Maternal Uncle    Diabetes Maternal Uncle    Hypertension Maternal Uncle    Hypertension Son     Past medical history, surgical history, medications, allergies, family history and social history reviewed with patient today and changes made to appropriate areas of the chart.   Review of Systems  Constitutional: Negative.   HENT: Negative.    Eyes: Negative.   Respiratory: Negative.    Cardiovascular: Negative.   Gastrointestinal: Negative.   Genitourinary: Negative.   Musculoskeletal:  Positive for back pain, joint pain (right shoulder) and neck pain.  Skin: Negative.   Neurological: Negative.   Psychiatric/Behavioral: Negative.     All other ROS negative except what is listed above and in the HPI.      Objective:    BP 118/82 (BP Location: Left Arm)   Pulse 70   Temp (!) 97.5 F (36.4 C)    Ht 5\' 2"  (1.575 m)   Wt 120 lb (54.4 kg)   SpO2 98%   BMI 21.95 kg/m   Wt Readings from Last 3 Encounters:  12/31/22 120 lb (54.4 kg)  09/16/22 125 lb (56.7 kg)  07/04/22 136 lb (61.7 kg)    Physical Exam Vitals and nursing note reviewed.  Constitutional:      General: She is not in acute distress.    Appearance: Normal appearance.  HENT:     Head: Normocephalic and atraumatic.     Right Ear: Tympanic membrane, ear canal and external ear normal.     Left Ear: Tympanic membrane, ear canal and external ear normal.     Mouth/Throat:     Mouth: Mucous membranes are moist.     Pharynx: No oropharyngeal exudate or posterior oropharyngeal erythema.  Eyes:     Conjunctiva/sclera: Conjunctivae normal.  Cardiovascular:     Rate and Rhythm: Normal rate and regular rhythm.     Pulses: Normal pulses.     Heart sounds: Normal heart sounds.  Pulmonary:     Effort: Pulmonary effort is normal.     Breath sounds: Normal breath sounds.  Abdominal:     Palpations: Abdomen is soft.     Tenderness: There is no abdominal tenderness.  Musculoskeletal:        General: Normal range of motion.     Cervical back: Normal range of motion and neck supple.     Right lower leg: No edema.     Left lower leg: No edema.  Lymphadenopathy:     Cervical: No cervical adenopathy.  Skin:  General: Skin is warm and dry.  Neurological:     General: No focal deficit present.     Mental Status: She is alert and oriented to person, place, and time.     Cranial Nerves: No cranial nerve deficit.     Coordination: Coordination normal.     Gait: Gait normal.  Psychiatric:        Mood and Affect: Mood normal.        Behavior: Behavior normal.        Thought Content: Thought content normal.        Judgment: Judgment normal.     Results for orders placed or performed in visit on 07/04/22  CBC and differential  Result Value Ref Range   Hemoglobin 15.3 12.0 - 16.0   HCT 45 36 - 46  Basic metabolic panel   Result Value Ref Range   Glucose 573    BUN 46 (A) 4 - 21   Creatinine 2.1 (A) 0.5 - 1.1   Potassium 4.0 3.5 - 5.1 mEq/L   Sodium 140 137 - 147   Chloride 102 99 - 108  Basic metabolic panel  Result Value Ref Range   Glucose 134    BUN 18 4 - 21   CO2 24 (A) 13 - 22   Creatinine 1.1 0.5 - 1.1   Potassium 2.8 (A) 3.5 - 5.1 mEq/L   Sodium 139 137 - 147   Chloride 106 99 - 108  Comprehensive metabolic panel  Result Value Ref Range   eGFR 58    Calcium 10.0 8.7 - 10.7   Albumin 4.8 3.5 - 5.0  Hepatic function panel  Result Value Ref Range   Alkaline Phosphatase 71 25 - 125   ALT 12 7 - 35 U/L   AST 17 13 - 35      Assessment & Plan:   Problem List Items Addressed This Visit       Cardiovascular and Mediastinum   Migraine without aura and without status migrainosus, not intractable    Chronic, stable.  She takes Topamax ER 200 mg daily at bedtime for preventative and sumatriptan injection as needed for acute migraine.  Will continue this regimen.  She does not need refills today.      Relevant Medications   lamoTRIgine (LAMICTAL) 200 MG tablet     Endocrine   Type 2 diabetes mellitus without complication, without long-term current use of insulin (HCC)    Chronic, stable.  Continue Mounjaro 7.5 mg injections every 10 to 14 days.  Will check A1c, CMP, CBC, urine microalbumin today.  Recommend that she go to an eye doctor yearly.  Follow-up in 6 months.      Relevant Medications   tirzepatide (MOUNJARO) 7.5 MG/0.5ML Pen   Other Relevant Orders   CBC with Differential/Platelet   Comprehensive metabolic panel   Hemoglobin A1c   Lipid panel   Microalbumin / creatinine urine ratio     Other   Acute pain of right shoulder    She is still having ongoing pain in her right shoulder.  Encouraged her to get the x-ray done so we can do an MRI if needed.  Information given on where she can go to the get this.      Pure hypercholesterolemia    Chronic, stable.  Continue with  exercise and weight loss.  Check lipid panel today  The 10-year ASCVD risk score (Arnett DK, et al., 2019) is: 3.3%   Values used to calculate  the score:     Age: 5 years     Sex: Female     Is Non-Hispanic African American: No     Diabetic: Yes     Tobacco smoker: No     Systolic Blood Pressure: 118 mmHg     Is BP treated: No     HDL Cholesterol: 73 MG/DL     Total Cholesterol: 262 MG/DL       Anxiety    Chronic, stable.  Continue fluoxetine 10 mg daily and Xanax 1 mg daily at bedtime as needed.  She is currently following with a psychiatrist who is prescribing these.      Routine general medical examination at a health care facility - Primary    Health maintenance reviewed and updated. Discussed nutrition, exercise. Check CMP, CBC today. Follow-up 1 year.        Vitamin D deficiency    She is currently taking vitamin D 5000 units daily.  Will check vitamin D levels and adjust regimen based on results.      Relevant Orders   VITAMIN D 25 Hydroxy (Vit-D Deficiency, Fractures)     Follow up plan: Return in about 6 months (around 07/03/2023).   LABORATORY TESTING:  - Pap smear: done elsewhere - requesting records  IMMUNIZATIONS:   - Tdap: Tetanus vaccination status reviewed: tetanus status unknown to the patient,. - Influenza:  declined today - Pneumovax: Not applicable - Prevnar: Not applicable - HPV: Not applicable - Shingrix vaccine:  declined today  SCREENING: -Mammogram: Done elsewhere - requesting records - Colonoscopy: Up to date  - Bone Density: Not applicable   PATIENT COUNSELING:   Advised to take 1 mg of folate supplement per day if capable of pregnancy.   Sexuality: Discussed sexually transmitted diseases, partner selection, use of condoms, avoidance of unintended pregnancy  and contraceptive alternatives.   Advised to avoid cigarette smoking.  I discussed with the patient that most people either abstain from alcohol or drink within safe limits  (<=14/week and <=4 drinks/occasion for males, <=7/weeks and <= 3 drinks/occasion for females) and that the risk for alcohol disorders and other health effects rises proportionally with the number of drinks per week and how often a drinker exceeds daily limits.  Discussed cessation/primary prevention of drug use and availability of treatment for abuse.   Diet: Encouraged to adjust caloric intake to maintain  or achieve ideal body weight, to reduce intake of dietary saturated fat and total fat, to limit sodium intake by avoiding high sodium foods and not adding table salt, and to maintain adequate dietary potassium and calcium preferably from fresh fruits, vegetables, and low-fat dairy products.    stressed the importance of regular exercise  Injury prevention: Discussed safety belts, safety helmets, smoke detector, smoking near bedding or upholstery.   Dental health: Discussed importance of regular tooth brushing, flossing, and dental visits.    NEXT PREVENTATIVE PHYSICAL DUE IN 1 YEAR. Return in about 6 months (around 07/03/2023).  Selso Mannor A Farryn Linares

## 2022-12-31 NOTE — Assessment & Plan Note (Signed)
She is currently taking vitamin D 5000 units daily.  Will check vitamin D levels and adjust regimen based on results.

## 2022-12-31 NOTE — Assessment & Plan Note (Signed)
Health maintenance reviewed and updated. Discussed nutrition, exercise. Check CMP, CBC today. Follow-up 1 year.   

## 2022-12-31 NOTE — Assessment & Plan Note (Signed)
Chronic, stable.  Continue Mounjaro 7.5 mg injections every 10 to 14 days.  Will check A1c, CMP, CBC, urine microalbumin today.  Recommend that she go to an eye doctor yearly.  Follow-up in 6 months.

## 2022-12-31 NOTE — Patient Instructions (Signed)
It was great to see you!  We are checking your labs today and will let you know the results via mychart/phone.   Keep up the great work!  Let's follow-up in 6 months, sooner if you have concerns.  If a referral was placed today, you will be contacted for an appointment. Please note that routine referrals can sometimes take up to 3-4 weeks to process. Please call our office if you haven't heard anything after this time frame.  Take care,  Lauren McElwee, NP  

## 2022-12-31 NOTE — Assessment & Plan Note (Signed)
Chronic, stable.  She takes Topamax ER 200 mg daily at bedtime for preventative and sumatriptan injection as needed for acute migraine.  Will continue this regimen.  She does not need refills today.

## 2022-12-31 NOTE — Assessment & Plan Note (Addendum)
She is still having ongoing pain in her right shoulder.  Encouraged her to get the x-ray done so we can do an MRI if needed.  Information given on where she can go to the get this.

## 2022-12-31 NOTE — Assessment & Plan Note (Signed)
Chronic, stable.  Continue fluoxetine 10 mg daily and Xanax 1 mg daily at bedtime as needed.  She is currently following with a psychiatrist who is prescribing these.

## 2023-01-01 ENCOUNTER — Encounter: Payer: Self-pay | Admitting: Nurse Practitioner

## 2023-01-01 DIAGNOSIS — Z1211 Encounter for screening for malignant neoplasm of colon: Secondary | ICD-10-CM

## 2023-01-02 MED ORDER — ROSUVASTATIN CALCIUM 20 MG PO TABS: 20.0000 mg | ORAL_TABLET | Freq: Every day | ORAL | 3 refills | Status: AC

## 2023-01-16 NOTE — Telephone Encounter (Signed)
New phone encounter created for [facility] as requested.

## 2023-01-17 NOTE — Addendum Note (Signed)
Addended by: Rodman Pickle A on: 01/17/2023 08:35 AM   Modules accepted: Orders

## 2023-02-23 ENCOUNTER — Encounter: Payer: Self-pay | Admitting: Nurse Practitioner

## 2023-02-23 DIAGNOSIS — N281 Cyst of kidney, acquired: Secondary | ICD-10-CM

## 2023-03-10 ENCOUNTER — Other Ambulatory Visit: Payer: Medicare Other

## 2023-03-11 ENCOUNTER — Other Ambulatory Visit: Payer: Medicare Other

## 2023-03-11 ENCOUNTER — Encounter: Payer: Self-pay | Admitting: Nurse Practitioner

## 2023-03-11 ENCOUNTER — Telehealth: Payer: Self-pay | Admitting: Internal Medicine

## 2023-03-11 MED ORDER — TIRZEPATIDE 5 MG/0.5ML ~~LOC~~ SOAJ: 5.0000 mg | SUBCUTANEOUS | 0 refills | Status: DC

## 2023-03-11 NOTE — Addendum Note (Signed)
Addended by: Rodman Pickle A on: 03/11/2023 04:06 PM   Modules accepted: Orders

## 2023-03-11 NOTE — Telephone Encounter (Addendum)
Good Morning Dr. Leonides Schanz,   Supervising MD for today 11/5 AM   Patient called stating that we had received a referral for her to have a colonoscopy. Patients last colonoscopy was in August of 2021 with [facility].  Patient stated that she is currently having GI symptoms but did not disclose what issues. Patients records from colonoscopy are under the media tab. Will you please review and advise on scheduling patient?  Thank you.

## 2023-03-11 NOTE — Telephone Encounter (Signed)
Okay to schedule for direct colonoscopy in the [facility] for history of colon polyps unless patient would prefer an OV to talk about her GI symptoms.  Colonoscopy 01/04/20: Excellent prep. 1 cm nodular laterally spreading lesion that was resected entirely with cold snare. Distal rectum with probable mild proctitis (petechiae). Small internal hemorrhoids. Biopsies obtained from right colon, left colon, and rectum. Path: A.  LARGE INTESTINE, CECUM, ENDOSCOPIC BIOPSY:   FRAGMENTS OF TUBULAR ADENOMA.  B.  LARGE INTESTINE, RANDOM ASCENDING, ENDOSCOPIC BIOPSY:   FRAGMENTS OF UNREMARKABLE COLONIC MUCOSA.   NO EVIDENCE OF COLITIS.  C.  LARGE INTESTINE, RANDOM DESCENDING, ENDOSCOPIC BIOPSY:   FRAGMENTS OF UNREMARKABLE COLONIC MUCOSA.   NO EVIDENCE OF COLITIS.  D.  LARGE INTESTINE, RECTUM, ENDOSCOPIC BIOPSY:   FRAGMENTS OF UNREMARKABLE COLONIC MUCOSA.   NO EVIDENCE OF COLITIS.

## 2023-03-12 ENCOUNTER — Ambulatory Visit
Admission: RE | Admit: 2023-03-12 | Discharge: 2023-03-12 | Disposition: A | Discharge status: Home / Self Care | Payer: Medicare Other | Source: Ambulatory Visit | Attending: Nurse Practitioner | Admitting: Nurse Practitioner

## 2023-03-12 DIAGNOSIS — N281 Cyst of kidney, acquired: Secondary | ICD-10-CM

## 2023-03-18 ENCOUNTER — Encounter: Payer: Self-pay | Admitting: Nurse Practitioner

## 2023-03-18 DIAGNOSIS — F419 Anxiety disorder, unspecified: Secondary | ICD-10-CM

## 2023-03-18 NOTE — Telephone Encounter (Signed)
Spoke with patient, she wanted to schedule a colonoscopy, but did not wish to wait till January for morning appointments. States she would call her PCP to see if she should go back to her previous GI to get it done sooner.

## 2023-05-21 DIAGNOSIS — M25562 Pain in left knee: Secondary | ICD-10-CM | POA: Diagnosis not present

## 2023-05-21 DIAGNOSIS — G894 Chronic pain syndrome: Secondary | ICD-10-CM | POA: Diagnosis not present

## 2023-05-21 DIAGNOSIS — M25561 Pain in right knee: Secondary | ICD-10-CM | POA: Diagnosis not present

## 2023-05-21 DIAGNOSIS — Q0700 Arnold-Chiari syndrome without spina bifida or hydrocephalus: Secondary | ICD-10-CM | POA: Diagnosis not present

## 2023-05-21 DIAGNOSIS — Z79899 Other long term (current) drug therapy: Secondary | ICD-10-CM | POA: Diagnosis not present

## 2023-05-21 DIAGNOSIS — M542 Cervicalgia: Secondary | ICD-10-CM | POA: Diagnosis not present

## 2023-05-30 ENCOUNTER — Other Ambulatory Visit: Payer: Self-pay | Admitting: Nurse Practitioner

## 2023-05-30 NOTE — Telephone Encounter (Signed)
Requesting: FLUTICASONE 50MCG/IN AER  Last Visit: 12/31/2022 Next Visit: Visit date not found Last Refill: 11/19/2021 by Historical Provider  Please Advise

## 2023-06-13 ENCOUNTER — Encounter: Payer: Self-pay | Admitting: Nurse Practitioner

## 2023-06-13 ENCOUNTER — Other Ambulatory Visit: Payer: Self-pay | Admitting: Nurse Practitioner

## 2023-06-13 ENCOUNTER — Ambulatory Visit: Payer: Medicare Other

## 2023-06-13 ENCOUNTER — Telehealth (INDEPENDENT_AMBULATORY_CARE_PROVIDER_SITE_OTHER): Payer: Medicare Other | Admitting: Nurse Practitioner

## 2023-06-13 ENCOUNTER — Other Ambulatory Visit: Payer: Medicare Other

## 2023-06-13 ENCOUNTER — Ambulatory Visit: Payer: Self-pay | Admitting: Nurse Practitioner

## 2023-06-13 VITALS — Temp 99.0°F | Ht 62.0 in | Wt 115.0 lb

## 2023-06-13 DIAGNOSIS — R6889 Other general symptoms and signs: Secondary | ICD-10-CM

## 2023-06-13 DIAGNOSIS — J101 Influenza due to other identified influenza virus with other respiratory manifestations: Secondary | ICD-10-CM

## 2023-06-13 DIAGNOSIS — J069 Acute upper respiratory infection, unspecified: Secondary | ICD-10-CM

## 2023-06-13 LAB — POCT INFLUENZA A/B
Influenza A, POC: POSITIVE — AB
Influenza A, POC: POSITIVE — AB
Influenza B, POC: NEGATIVE
Influenza B, POC: NEGATIVE

## 2023-06-13 MED ORDER — OSELTAMIVIR PHOSPHATE 75 MG PO CAPS: 75.0000 mg | ORAL_CAPSULE | Freq: Two times a day (BID) | ORAL | 0 refills | Status: AC

## 2023-06-13 NOTE — Addendum Note (Signed)
 Addended by: Lonie Roa on: 06/13/2023 02:50 PM   Modules accepted: Orders

## 2023-06-13 NOTE — Telephone Encounter (Signed)
 I called and spoke with patient and urged her to go to Urgent Care an notified her there is a new [facility] nearby to be seen. Patient is aware and does not want to pay the $100.00 to be seen there and will think about it.

## 2023-06-13 NOTE — Addendum Note (Signed)
 Addended by: Aldea Avis A on: 06/13/2023 02:56 PM   Modules accepted: Orders

## 2023-06-13 NOTE — Patient Instructions (Signed)
 It was great to see you!  Drink plenty of fluids and get rest  You are contagious until you haven't had a fever for 48 hours   Call when you get to our office for the flu test.   Let's follow-up if your symptoms worsen or don't improve.   Take care,  Tinnie Harada, NP

## 2023-06-13 NOTE — Progress Notes (Signed)
 [facility] [address] Angelica Butler [address] Dept: [phone] Dept Fax: [phone]  Virtual Video Visit  I connected with Angelica Butler on 06/13/23 at  1:40 PM EST by a video enabled telemedicine application and verified that I am speaking with the correct person using two identifiers.  Location patient: Home Location provider: Clinic Persons participating in the virtual visit: Patient; Angelica Harada, NP; Angelica Butler, CMA  I discussed the limitations of evaluation and management by telemedicine and the availability of in person appointments. The patient expressed understanding and agreed to proceed.  Chief Complaint  Patient presents with   Cough    Chest congestion, chills, bodyache, sore throat, headache since yesterday    SUBJECTIVE:  HPI: Angelica Butler is a 58 y.o. female who presents with chills, congestion, and body aches since yesterday.  UPPER RESPIRATORY TRACT INFECTION  Fever: yes Cough: yes Shortness of breath: no Wheezing: no Chest pain: no Chest tightness: yes Chest congestion: no Nasal congestion: yes Runny nose: no Post nasal drip: yes Sneezing: no Sore throat: yes Swollen glands: no Sinus pressure: yes Headache: yes Face pain: yes Toothache: no Ear pain: no bilateral Ear pressure: no bilateral Eyes red/itching:no Eye drainage/crusting: no  Vomiting: no Rash: no Fatigue: yes Sick contacts: no Strep contacts: no  Context: worse Recurrent sinusitis: no Relief with OTC cold/cough medications: yes  Treatments attempted: flonase, dayquil, ibuprofen     Patient Active Problem List   Diagnosis Date Noted   Routine general medical examination at a health care facility 12/31/2022   Vitamin D  deficiency 12/31/2022   Type 2 diabetes mellitus without complication, without long-term current use of insulin (HCC) 07/12/2022   Acute pain of right shoulder 07/04/2022   Pure  hypercholesterolemia 07/04/2022   Anxiety 07/04/2022   Arthritis 07/04/2022   Migraine without aura and without status migrainosus, not intractable 07/04/2022    Past Surgical History:  Procedure Laterality Date   CESAREAN SECTION     ENDOMETRIAL ABLATION     RADIOFREQUENCY ABLATION NERVES     TUBAL LIGATION  June 2006    Family History  Problem Relation Age of Onset   Breast cancer Maternal Aunt    Breast cancer Paternal Grandmother    Diabetes Paternal Grandmother    Heart disease Paternal Grandmother    Diabetes Mother    Hypertension Mother    Kidney disease Mother    Obesity Mother    Stroke Mother    Cancer Father    Diabetes Father    Early death Father    Hearing loss Father    Hypertension Father    Hypertension Maternal Grandfather    Arthritis Maternal Grandmother    Cancer Maternal Grandmother    Miscarriages / Stillbirths Maternal Grandmother    Varicose Veins Maternal Grandmother    Hypertension Paternal Grandfather    Diabetes Maternal Uncle    Diabetes Brother    Hypertension Brother    Obesity Brother    Diabetes Brother    Hypertension Brother    Obesity Brother    Diabetes Maternal Uncle    Hypertension Maternal Uncle    Obesity Maternal Uncle    Diabetes Maternal Uncle    Hypertension Maternal Uncle    Hypertension Son     Social History   Tobacco Use   Smoking status: Never   Smokeless tobacco: Never  Vaping Use   Vaping status: Never Used  Substance Use Topics   Alcohol use: No  Drug use: No     Current Outpatient Medications:    ALPRAZolam (XANAX) 1 MG tablet, Take 1 mg by mouth at bedtime as needed for anxiety., Disp: , Rfl:    amphetamine-dextroamphetamine (ADDERALL) 30 MG tablet, Take 1 tablet by mouth daily., Disp: , Rfl:    cholecalciferol (VITAMIN D3) 25 MCG (1000 UNIT) tablet, Take 5,000 Units by mouth daily., Disp: , Rfl:    Estradiol-Norethindrone Acet 0.5-0.1 MG tablet, Take 1 tablet by mouth daily., Disp: , Rfl:     FLUoxetine (PROZAC) 10 MG tablet, Take 10 mg by mouth daily., Disp: , Rfl:    fluticasone (FLONASE) 50 MCG/ACT nasal spray, ADMINISTER 1 SPRAY IN EACH NOSTRIL TWO TIMES DAILY., Disp: 16 mL, Rfl: 5   ibuprofen  (ADVIL ,MOTRIN ) 600 MG tablet, Take 1 tablet (600 mg total) by mouth every 6 (six) hours as needed., Disp: 30 tablet, Rfl: 0   lamoTRIgine (LAMICTAL) 200 MG tablet, Take 200 mg by mouth 2 (two) times daily., Disp: , Rfl:    lisdexamfetamine (VYVANSE) 70 MG capsule, Take 70 mg by mouth daily., Disp: , Rfl:    ondansetron  (ZOFRAN ) 4 MG tablet, Take 4 mg by mouth every 8 (eight) hours as needed for nausea or vomiting., Disp: , Rfl:    rosuvastatin  (CRESTOR ) 20 MG tablet, Take 1 tablet (20 mg total) by mouth daily., Disp: 90 tablet, Rfl: 3   SUMAtriptan  Succinate (ZEMBRACE SYMTOUCH ) 3 MG/0.5ML SOAJ, Inject 3 mg into the skin as directed., Disp: 2 pen, Rfl: 0   tirzepatide  (MOUNJARO ) 5 MG/0.5ML Pen, Inject 5 mg into the skin once a week., Disp: 6 mL, Rfl: 0   Topiramate  ER (TROKENDI  XR) 200 MG CP24, Take 200 mg by mouth at bedtime., Disp: 14 capsule, Rfl: 0   traMADol (ULTRAM-ER) 200 MG 24 hr tablet, Take 200 mg by mouth daily., Disp: , Rfl:   Allergies  Allergen Reactions   Other Other (See Comments)    Cat Dander-cause itchy eyes    ROS: See pertinent positives and negatives per HPI.  OBSERVATIONS/OBJECTIVE:  VITALS per patient if applicable: Today's Vitals   06/13/23 1322  Temp: 99 F (37.2 C)  Weight: 115 lb (52.2 kg)  Height: 5' 2 (1.575 m)   Body mass index is 21.03 kg/m.    GENERAL: Alert and oriented. Appears well and in no acute distress.  HEENT: Atraumatic. Conjunctiva clear. No obvious abnormalities on inspection of external nose and ears.  NECK: Normal movements of the head and neck.  LUNGS: On inspection, no signs of respiratory distress. Breathing rate appears normal. No obvious gross SOB, gasping or wheezing, and no conversational dyspnea.  CV: No obvious  cyanosis.  MS: Moves all visible extremities without noticeable abnormality.  PSYCH/NEURO: Pleasant and cooperative. No obvious depression or anxiety. Speech and thought processing grossly intact.  ASSESSMENT AND PLAN:  Problem List Items Addressed This Visit   None Visit Diagnoses       Upper respiratory tract infection, unspecified type    -  Primary   Home covid test negative, will have her come in office for flu test. Encourage fluids, rest. Keep taking OTC meds as needed. Will treat with tamiflu  if positive   Relevant Orders   POCT Influenza A/B        I discussed the assessment and treatment plan with the patient. The patient was provided an opportunity to ask questions and all were answered. The patient agreed with the plan and demonstrated an understanding of the instructions.  The patient was advised to call back or seek an in-person evaluation if the symptoms worsen or if the condition fails to improve as anticipated.   Angelica DELENA Harada, NP

## 2023-06-13 NOTE — Telephone Encounter (Signed)
  Chief Complaint: severe headache Symptoms: Sore throat, cough, body aches, cold symptoms, fever 100.9 at 6am today, facial swelling/ eyelids swollen, chest pain, bloody discharge when blowing nose, Frequency: since yesterday and worsening Disposition: [x] ED /[] Urgent Care (no appt availability in office) / [] Appointment(In office/virtual)/ []  [facility]/ [] Home Care/ [x] Refused Recommended Disposition /[] [place] [facility]/ []  Follow-up with PCP Additional Notes: Patient states since yesterday she started to feel sick with a sore throat and felt run down. She states she woke up with the worst headache ever last night and her chest was really, really tight. Patient states she has taken Dayquil and Robitussin. Patient took home COVID test that was negative. Patient refused ED disposition and states she thinks this is just strep, requesting to be seen in office. Called [facility] and staff are aware.  Copied from CRM [id number]. Topic: Clinical - Red Word Triage >> Jun 13, 2023  8:26 AM Leotis ORN wrote: [facility] that prompted transfer to Nurse Triage: can barely lift head, chest tight,  worst headache she's ever had, feels like she was hit in the head with a baseball bat, fever, throat pain difficuly swallowing, body aches, chest pain, she stated everything hurts - presented yesterday afternoon as tingle in throat and progressively got worse Reason for Disposition  [1] SEVERE headache AND [2] fever  Answer Assessment - Initial Assessment Questions 1. LOCATION: Where does it hurt?      Entire head.  2. ONSET: When did the headache start? (Minutes, hours or days)      Yesterday.  3. PATTERN: Does the pain come and go, or has it been constant since it started?     Its gotten slightly worse since yesterday.  4. SEVERITY: How bad is the pain? and What does it keep you from doing?  (e.g., Scale 1-10; mild, moderate, or severe)   - MILD (1-3): doesn't interfere with normal  activities    - MODERATE (4-7): interferes with normal activities or awakens from sleep    - SEVERE (8-10): excruciating pain, unable to do any normal activities        9/10.  5. RECURRENT SYMPTOM: Have you ever had headaches before? If Yes, ask: When was the last time? and What happened that time?      Yes, its been about 3 weeks ago.  6. CAUSE: What do you think is causing the headache?     Feels like it is related to sore throat and upper respiratory symptoms, it feels like part of my entire body aches 7. MIGRAINE: Have you been diagnosed with migraine headaches? If Yes, ask: Is this headache similar?      Yes; this feels much more severe than normal migraines.  8. HEAD INJURY: Has there been any recent injury to the head?      Denies.  9. OTHER SYMPTOMS: Do you have any other symptoms? (fever, stiff neck, eye pain, sore throat, cold symptoms)     Sore throat, cough, body aches, cold symptoms, fever 100.9 at 6am today, facial swelling/ eyelids swollen.  Protocols used: [facility]

## 2023-06-13 NOTE — Telephone Encounter (Signed)
 Patient scheduled for a Mychart visit for 06/13/2023.

## 2023-06-13 NOTE — Telephone Encounter (Signed)
 Spoke with pt, scheduled a mychart visit with Lauren for 06/13/23.

## 2023-07-02 ENCOUNTER — Ambulatory Visit: Payer: Medicare Other | Admitting: Nurse Practitioner

## 2023-07-16 DIAGNOSIS — M542 Cervicalgia: Secondary | ICD-10-CM | POA: Diagnosis not present

## 2023-07-16 DIAGNOSIS — Z79899 Other long term (current) drug therapy: Secondary | ICD-10-CM | POA: Diagnosis not present

## 2023-07-16 DIAGNOSIS — M25561 Pain in right knee: Secondary | ICD-10-CM | POA: Diagnosis not present

## 2023-07-16 DIAGNOSIS — Q0700 Arnold-Chiari syndrome without spina bifida or hydrocephalus: Secondary | ICD-10-CM | POA: Diagnosis not present

## 2023-07-16 DIAGNOSIS — M7918 Myalgia, other site: Secondary | ICD-10-CM | POA: Diagnosis not present

## 2023-07-16 DIAGNOSIS — M25562 Pain in left knee: Secondary | ICD-10-CM | POA: Diagnosis not present

## 2023-07-16 DIAGNOSIS — G894 Chronic pain syndrome: Secondary | ICD-10-CM | POA: Diagnosis not present

## 2023-07-23 ENCOUNTER — Encounter: Payer: Self-pay | Admitting: Nurse Practitioner

## 2023-07-23 DIAGNOSIS — N281 Cyst of kidney, acquired: Secondary | ICD-10-CM

## 2023-08-05 ENCOUNTER — Encounter (INDEPENDENT_AMBULATORY_CARE_PROVIDER_SITE_OTHER): Payer: Self-pay | Admitting: Nurse Practitioner

## 2023-08-05 DIAGNOSIS — N281 Cyst of kidney, acquired: Secondary | ICD-10-CM | POA: Diagnosis not present

## 2023-08-05 NOTE — Telephone Encounter (Signed)

## 2023-08-15 ENCOUNTER — Telehealth: Payer: Self-pay

## 2023-08-15 NOTE — Telephone Encounter (Signed)
 Copied from CRM [id number]. Topic: Clinical - Request for Lab/Test Order >> Aug 15, 2023  3:26 PM Sim Boast F wrote: Reason for CRM: [facility] called says they have 2 orders for patients CT that is scheduled 4/22 and wants to know if it should be with or without contrast? Please call [phone] opt 1 then opt 4

## 2023-08-18 NOTE — Telephone Encounter (Signed)
 I called and spoke with Phalisha in Radiology and notified her that CT needs to be with contrast and she see an order in system from 08/15/23.

## 2023-08-19 ENCOUNTER — Telehealth: Payer: Self-pay | Admitting: Nurse Practitioner

## 2023-08-19 NOTE — Telephone Encounter (Signed)
 Pt stated she has decided to drink the dye before her CT scan to get the best results. Her appt is at 10:00 is she okay to drink it then? Please call ASAP  [phone]

## 2023-08-19 NOTE — Telephone Encounter (Signed)
 I called and spoke with patient and notified her of the below message. She will call radiology.

## 2023-08-26 ENCOUNTER — Ambulatory Visit
Admission: RE | Admit: 2023-08-26 | Discharge: 2023-08-26 | Disposition: A | Discharge status: Home / Self Care | Source: Ambulatory Visit | Attending: Nurse Practitioner | Admitting: Nurse Practitioner

## 2023-08-26 ENCOUNTER — Other Ambulatory Visit

## 2023-08-26 DIAGNOSIS — N281 Cyst of kidney, acquired: Secondary | ICD-10-CM | POA: Diagnosis not present

## 2023-08-26 DIAGNOSIS — N200 Calculus of kidney: Secondary | ICD-10-CM | POA: Diagnosis not present

## 2023-08-26 MED ORDER — IOPAMIDOL (ISOVUE-300) INJECTION 61%
80.0000 mL | Freq: Once | INTRAVENOUS | Status: AC | PRN
  Administered 2023-08-26: 80 mL via INTRAVENOUS

## 2023-08-27 ENCOUNTER — Other Ambulatory Visit: Payer: Self-pay | Admitting: Nurse Practitioner

## 2023-08-27 ENCOUNTER — Encounter: Payer: Self-pay | Admitting: Nurse Practitioner

## 2023-08-27 DIAGNOSIS — N281 Cyst of kidney, acquired: Secondary | ICD-10-CM

## 2023-08-27 DIAGNOSIS — N2889 Other specified disorders of kidney and ureter: Secondary | ICD-10-CM

## 2023-09-10 NOTE — Addendum Note (Signed)
 Addended by: Malissa Slay A on: 09/10/2023 03:59 PM   Modules accepted: Orders

## 2023-09-22 ENCOUNTER — Ambulatory Visit
Admission: RE | Admit: 2023-09-22 | Discharge: 2023-09-22 | Disposition: A | Discharge status: Home / Self Care | Source: Ambulatory Visit | Attending: Nurse Practitioner | Admitting: Nurse Practitioner

## 2023-09-22 DIAGNOSIS — N281 Cyst of kidney, acquired: Secondary | ICD-10-CM

## 2023-09-22 DIAGNOSIS — K8020 Calculus of gallbladder without cholecystitis without obstruction: Secondary | ICD-10-CM | POA: Diagnosis not present

## 2023-09-22 MED ORDER — GADOPICLENOL 0.5 MMOL/ML IV SOLN
5.0000 mL | Freq: Once | INTRAVENOUS | Status: AC | PRN
  Administered 2023-09-22: 5 mL via INTRAVENOUS

## 2023-09-23 ENCOUNTER — Ambulatory Visit: Payer: Self-pay | Admitting: Nurse Practitioner

## 2023-09-30 NOTE — Progress Notes (Incomplete)
 Chief Complaint: No chief complaint on file.   History of Present Illness:  Angelica Butler is a 58 y.o. female who is seen in consultation from Odette Benjamin, NP for evaluation of complex right renal cyst.   Past Medical History:  Past Medical History:  Diagnosis Date   Anxiety 2016   Separation   Depression    Hyperlipidemia    Hypertension 2004   While pregnant with first child. He's under your care for HBP   Knee pain, chronic    Migraine    Neuromuscular disorder (HCC) 2005   Arthritis and Chiari malformation   Renal disorder    cysts on kidneys   Rheumatoid arthritis (HCC)    Type 2 diabetes mellitus (HCC)     Past Surgical History:  Past Surgical History:  Procedure Laterality Date   CESAREAN SECTION     ENDOMETRIAL ABLATION     RADIOFREQUENCY ABLATION NERVES     TUBAL LIGATION  June 2006    Allergies:  Allergies  Allergen Reactions   Other Other (See Comments)    Cat Dander-cause itchy eyes    Family History:  Family History  Problem Relation Age of Onset   Breast cancer Maternal Aunt    Breast cancer Paternal Grandmother    Diabetes Paternal Grandmother    Heart disease Paternal Grandmother    Diabetes Mother    Hypertension Mother    Kidney disease Mother    Obesity Mother    Stroke Mother    Cancer Father    Diabetes Father    Early death Father    Hearing loss Father    Hypertension Father    Hypertension Maternal Grandfather    Arthritis Maternal Grandmother    Cancer Maternal Grandmother    Miscarriages / Stillbirths Maternal Grandmother    Varicose Veins Maternal Grandmother    Hypertension Paternal Grandfather    Diabetes Maternal Uncle    Diabetes Brother    Hypertension Brother    Obesity Brother    Diabetes Brother    Hypertension Brother    Obesity Brother    Diabetes Maternal Uncle    Hypertension Maternal Uncle    Obesity Maternal Uncle    Diabetes Maternal Uncle    Hypertension Maternal Uncle    Hypertension  Son     Social History:  Social History   Tobacco Use   Smoking status: Never   Smokeless tobacco: Never  Vaping Use   Vaping status: Never Used  Substance Use Topics   Alcohol use: No   Drug use: No    Review of symptoms:  Constitutional:  Negative for unexplained weight loss, night sweats, fever, chills ENT:  Negative for nose bleeds, sinus pain, painful swallowing CV:  Negative for chest pain, shortness of breath, exercise intolerance, palpitations, loss of consciousness Resp:  Negative for cough, wheezing, shortness of breath GI:  Negative for nausea, vomiting, diarrhea, bloody stools GU:  Positives noted in HPI; otherwise negative for gross hematuria, dysuria, urinary incontinence Neuro:  Negative for seizures, poor balance, limb weakness, slurred speech Psych:  Negative for lack of energy, depression, anxiety Endocrine:  Negative for polydipsia, polyuria, symptoms of hypoglycemia (dizziness, hunger, sweating) Hematologic:  Negative for anemia, purpura, petechia, prolonged or excessive bleeding, use of anticoagulants  Allergic:  Negative for difficulty breathing or choking as a result of exposure to anything; no shellfish allergy; no allergic response (rash/itch) to materials, foods  Physical exam: There were no vitals taken for this  visit. GENERAL APPEARANCE:  Well appearing, well developed, well nourished, NAD HEENT: Atraumatic, Normocephalic. NECK: Normal appearance LUNGS: Normal inspiratory and expiratory excursion HEART: Regular Rate ABDOMEN: *** EXTREMITIES: Moves all extremities well.  Without clubbing, cyanosis, or edema. NEUROLOGIC:  Alert and oriented x 3, normal gait, CN II-XII grossly intact.  MENTAL STATUS:  Appropriate. SKIN:  Warm, dry and intact.    Results: No results found for this or any previous visit (from the past 24 hours).  I have reviewed prior patient's records  I have reviewed referring/prior physicians records  I have reviewed  urinalysis  I have reviewed prior urine cultures  I reviewed prior imaging studies  Assessment: No diagnosis found.   Plan: ***

## 2023-10-01 ENCOUNTER — Ambulatory Visit: Admitting: Urology

## 2023-10-01 DIAGNOSIS — N281 Cyst of kidney, acquired: Secondary | ICD-10-CM

## 2023-10-08 DIAGNOSIS — Z5181 Encounter for therapeutic drug level monitoring: Secondary | ICD-10-CM | POA: Diagnosis not present

## 2023-10-08 DIAGNOSIS — G894 Chronic pain syndrome: Secondary | ICD-10-CM | POA: Diagnosis not present

## 2023-10-08 DIAGNOSIS — M7918 Myalgia, other site: Secondary | ICD-10-CM | POA: Diagnosis not present

## 2023-10-08 DIAGNOSIS — M542 Cervicalgia: Secondary | ICD-10-CM | POA: Diagnosis not present

## 2023-10-08 DIAGNOSIS — Z79899 Other long term (current) drug therapy: Secondary | ICD-10-CM | POA: Diagnosis not present

## 2023-10-08 DIAGNOSIS — M25562 Pain in left knee: Secondary | ICD-10-CM | POA: Diagnosis not present

## 2023-10-08 DIAGNOSIS — Q0700 Arnold-Chiari syndrome without spina bifida or hydrocephalus: Secondary | ICD-10-CM | POA: Diagnosis not present

## 2023-10-08 DIAGNOSIS — M25561 Pain in right knee: Secondary | ICD-10-CM | POA: Diagnosis not present

## 2023-11-04 NOTE — Progress Notes (Signed)
 Chief Complaint: No chief complaint on file.   History of Present Illness:  Angelica Butler is a 58 y.o. female who is seen in consultation from [facility], Tinnie LABOR, NP for evaluation of a complex right renal cyst.  Apparently, she has been followed for complex renal cysts on a 6 to 30-month basis since her last child was born.  That was under 20 years ago.  A recent renal ultrasound revealed calcification in the 17 mm right renal cyst.  CT revealed possible Bosniak 3 lesion/cyst.  She then underwent MRI of her abdomen revealing involution of her right renal cyst, characterized now as Bosniak category 2.  CT also showed bilateral punctate renal calculi.  She has passed several stones in the past.  Currently asymptomatic from stones.   Past Medical History:  Past Medical History:  Diagnosis Date   Anxiety 2016   Separation   Depression    Hyperlipidemia    Hypertension 2004   While pregnant with first child. He's under your care for HBP   Knee pain, chronic    Migraine    Neuromuscular disorder (HCC) 2005   Arthritis and Chiari malformation   Renal disorder    cysts on kidneys   Rheumatoid arthritis (HCC)    Type 2 diabetes mellitus (HCC)     Past Surgical History:  Past Surgical History:  Procedure Laterality Date   CESAREAN SECTION     ENDOMETRIAL ABLATION     RADIOFREQUENCY ABLATION NERVES     TUBAL LIGATION  June 2006    Allergies:  Allergies  Allergen Reactions   Other Other (See Comments)    Cat Dander-cause itchy eyes    Family History:  Family History  Problem Relation Age of Onset   Breast cancer Maternal Aunt    Breast cancer Paternal Grandmother    Diabetes Paternal Grandmother    Heart disease Paternal Grandmother    Diabetes Mother    Hypertension Mother    Kidney disease Mother    Obesity Mother    Stroke Mother    Cancer Father    Diabetes Father    Early death Father    Hearing loss Father    Hypertension Father    Hypertension Maternal  Grandfather    Arthritis Maternal Grandmother    Cancer Maternal Grandmother    Miscarriages / Stillbirths Maternal Grandmother    Varicose Veins Maternal Grandmother    Hypertension Paternal Grandfather    Diabetes Maternal Uncle    Diabetes Brother    Hypertension Brother    Obesity Brother    Diabetes Brother    Hypertension Brother    Obesity Brother    Diabetes Maternal Uncle    Hypertension Maternal Uncle    Obesity Maternal Uncle    Diabetes Maternal Uncle    Hypertension Maternal Uncle    Hypertension Son     Social History:  Social History   Tobacco Use   Smoking status: Never   Smokeless tobacco: Never  Vaping Use   Vaping status: Never Used  Substance Use Topics   Alcohol use: No   Drug use: No    Review of symptoms:  Constitutional:  Negative for unexplained weight loss, night sweats, fever, chills ENT:  Negative for nose bleeds, sinus pain, painful swallowing CV:  Negative for chest pain, shortness of breath, exercise intolerance, palpitations, loss of consciousness Resp:  Negative for cough, wheezing, shortness of breath GI:  Negative for nausea, vomiting, diarrhea, bloody stools GU:  Positives noted in HPI; otherwise negative for gross hematuria, dysuria, urinary incontinence Neuro:  Negative for seizures, poor balance, limb weakness, slurred speech Psych:  Negative for lack of energy, depression, anxiety Endocrine:  Negative for polydipsia, polyuria, symptoms of hypoglycemia (dizziness, hunger, sweating) Hematologic:  Negative for anemia, purpura, petechia, prolonged or excessive bleeding, use of anticoagulants  Allergic:  Negative for difficulty breathing or choking as a result of exposure to anything; no shellfish allergy; no allergic response (rash/itch) to materials, foods  Physical exam: There were no vitals taken for this visit. GENERAL APPEARANCE:  Well appearing, well developed, well nourished, NAD HEENT: Atraumatic, Normocephalic. NECK:  Normal appearance LUNGS: Normal inspiratory and expiratory excursion HEART: Regular Rate EXTREMITIES: Moves all extremities well.  Without clubbing, cyanosis, or edema. NEUROLOGIC:  Alert and oriented x 3, normal gait, CN II-XII grossly intact.  MENTAL STATUS:  Appropriate. SKIN:  Warm, dry and intact.    Results: No results found for this or any previous visit (from the past 24 hours).  I have reviewed prior patient's records  I have reviewed referring/prior physicians records  I have reviewed urinalysis  I have reviewed prior urine cultures  I reviewed prior imaging studies--CT scan from April of this year reviewed.  Findings: 1. Complex 13 mm right renal cortical lesion (Bosniak 3). Consider further characterization with renal CT or MRI. Urology consultation recommended. 2. Additional simple bilateral renal cortical cysts and nonobstructing nephrolithiasis. No hydronephrosis.  MRI was performed on May 19 of this year.  Findings: IMPRESSION: 1. Lesion of the anterior midportion of the right kidney identified by prior CT is predominantly intrinsically T1 hyperintense without associated contrast enhancement. In retrospective review of prior examinations, this lesion has involuted over time and is consistent with involuted hemorrhagic or proteinaceous cyst remnant. No further follow-up or characterization is required (Bosniak category II). 2. Additional small benign bilateral fluid signal renal cortical cysts, for which no further follow-up or characterization is required (Bosniak category I). 3. Cholelithiasis.    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  I do not think she needs every 6 month renal ultrasounds.  I will have her come back in 1 year for confirmatory ultrasound  - She was sent out with  24-hour urine request.  We will notify her of results by way of MyChart

## 2023-11-05 ENCOUNTER — Encounter: Payer: Self-pay | Admitting: Urology

## 2023-11-05 ENCOUNTER — Ambulatory Visit: Admitting: Urology

## 2023-11-05 VITALS — BP 120/78 | HR 84 | Ht 62.0 in | Wt 106.0 lb

## 2023-11-05 DIAGNOSIS — N200 Calculus of kidney: Secondary | ICD-10-CM | POA: Diagnosis not present

## 2023-11-05 DIAGNOSIS — N281 Cyst of kidney, acquired: Secondary | ICD-10-CM | POA: Diagnosis not present

## 2023-11-05 LAB — URINALYSIS, ROUTINE W REFLEX MICROSCOPIC
Bilirubin, UA: NEGATIVE
Glucose, UA: NEGATIVE
Ketones, UA: NEGATIVE
Nitrite, UA: NEGATIVE
Protein,UA: NEGATIVE
RBC, UA: NEGATIVE
Specific Gravity, UA: 1.02 (ref 1.005–1.030)
Urobilinogen, Ur: 0.2 mg/dL (ref 0.2–1.0)
pH, UA: 7 (ref 5.0–7.5)

## 2023-11-05 LAB — MICROSCOPIC EXAMINATION

## 2023-12-01 ENCOUNTER — Telehealth: Payer: Self-pay

## 2023-12-01 NOTE — Telephone Encounter (Signed)
 Called the number left twice and the waited for someone to answer and the line disconnected. I will try to call again later.

## 2023-12-01 NOTE — Telephone Encounter (Signed)
 Copied from CRM [id number]. Topic: General - Other >> Dec 01, 2023  9:23 AM Chasity T wrote: Reason for RMF:[id number] is calling in to speak with Dr Nedra nurse regarding patient. Please contact back at [phone]

## 2023-12-02 NOTE — Telephone Encounter (Signed)
 Left a detailed message to return call and let us  know how we can help them.

## 2023-12-03 NOTE — Telephone Encounter (Signed)
 Noted

## 2023-12-03 NOTE — Telephone Encounter (Signed)
 Sherry from [facility] returned call regarding an inquiry about patient statin medication.

## 2023-12-03 NOTE — Telephone Encounter (Signed)
 I spoke with representative at [facility] and she questions if patient has diabetes and if so she needs to be on a statin. I told the representative that I will have to cal back with the answers. Are we able to give out such information.

## 2023-12-17 DIAGNOSIS — Q0700 Arnold-Chiari syndrome without spina bifida or hydrocephalus: Secondary | ICD-10-CM | POA: Diagnosis not present

## 2023-12-17 DIAGNOSIS — Z79899 Other long term (current) drug therapy: Secondary | ICD-10-CM | POA: Diagnosis not present

## 2023-12-17 DIAGNOSIS — G894 Chronic pain syndrome: Secondary | ICD-10-CM | POA: Diagnosis not present

## 2023-12-17 DIAGNOSIS — M7918 Myalgia, other site: Secondary | ICD-10-CM | POA: Diagnosis not present

## 2023-12-17 DIAGNOSIS — M542 Cervicalgia: Secondary | ICD-10-CM | POA: Diagnosis not present

## 2024-01-09 ENCOUNTER — Encounter: Payer: Self-pay | Admitting: Nurse Practitioner

## 2024-01-12 MED ORDER — TIRZEPATIDE 5 MG/0.5ML ~~LOC~~ SOAJ: 5.0000 mg | SUBCUTANEOUS | 0 refills | Status: DC

## 2024-01-12 NOTE — Telephone Encounter (Signed)
 Requesting: Tirzepatide  Last Visit: 06/13/2023 Next Visit: Visit date not found Last Refill: 03/11/2023  Please Advise

## 2024-01-23 ENCOUNTER — Other Ambulatory Visit: Payer: Self-pay | Admitting: Family

## 2024-01-23 MED ORDER — TIRZEPATIDE 7.5 MG/0.5ML ~~LOC~~ SOAJ: 7.5000 mg | SUBCUTANEOUS | 1 refills | Status: DC

## 2024-03-02 NOTE — Progress Notes (Signed)
 Angelica Butler                                          MRN: [id number]   03/02/2024   The [facility] Quality Team Specialist reviewed this patient medical record for the purposes of chart review for care gap closure. The following were reviewed: chart review for care gap closure-diabetic eye exam, glycemic status assessment, and kidney health evaluation for diabetes:eGFR  and uACR.    [facility]BCI Quality Team

## 2024-03-23 ENCOUNTER — Telehealth: Payer: Self-pay | Admitting: Pharmacist

## 2024-03-23 NOTE — Progress Notes (Signed)
 Pharmacy Quality Measure Review  This patient is appearing on the insurance-providing list for being at risk of failing the adherence measure for Statin Use in Persons with Diabetes (SUPD) medications this calendar year.   Prescribed rosuvastatin  20 mg daily, though last prescribed in August of 2024, not filled at all in 2025. Noted in medication reconciliation in July that she was not taking.   Called patient, left voicemail. Will send MyChart.   Catie IVAR Centers, PharmD, [facility] Clinical Pharmacist [phone]

## 2024-03-31 NOTE — Progress Notes (Signed)
 ERA PARR                                          MRN: [id number]   03/31/2024   The [facility]BCI Quality Team Specialist reviewed this patient medical record for the purposes of chart review for care gap closure. The following were reviewed: chart review for care gap closure-diabetic eye exam, glycemic status assessment, and kidney health evaluation for diabetes:eGFR  and uACR.    VBCI Quality Team

## 2024-04-20 NOTE — Progress Notes (Signed)
 Angelica Butler                                          MRN: [id number]   04/20/2024   The [facility] Quality Team Specialist reviewed this patient medical record for the purposes of chart review for care gap closure. The following were reviewed: chart review for care gap closure-diabetic eye exam, glycemic status assessment, and kidney health evaluation for diabetes:eGFR  and uACR.    [facility] Quality Team

## 2024-05-04 ENCOUNTER — Encounter: Payer: Self-pay | Admitting: Nurse Practitioner

## 2024-05-05 MED ORDER — TIRZEPATIDE 7.5 MG/0.5ML ~~LOC~~ SOAJ: 7.5000 mg | SUBCUTANEOUS | 0 refills | Status: AC

## 2024-05-28 NOTE — Progress Notes (Signed)
 Angelica Butler                                          MRN: [id number]   05/28/2024   The [facility] Quality Team Specialist reviewed this patient medical record for the purposes of chart review for care gap closure. The following were reviewed: chart review for care gap closure-glycemic status assessment.    [facility] Quality Team

## 2024-06-03 ENCOUNTER — Encounter: Payer: Self-pay | Admitting: *Deleted

## 2024-06-03 NOTE — Progress Notes (Unsigned)
 SKILAR MARCOU                                          MRN: [id number]   06/03/2024   The [facility] Quality Team Specialist reviewed this patient medical record for the purposes of chart review for care gap closure. The following were reviewed: {CHL AMB [facility] QUALITY SPECIALIST REASON FOR REVIEW:[id number]}.    [facility] Quality Team

## 2024-06-04 ENCOUNTER — Telehealth: Payer: Self-pay

## 2024-06-04 NOTE — Progress Notes (Unsigned)
 Pharmacy Quality Measure Review  This patient is appearing on a report for being at risk of failing the adherence measure for diabetes medications this calendar year.   Medication: Mounjaro  7.5mg /0.33mL Inj Last fill date: 05/05/2024 for 28 day supply  Patient should have completed her last injection this week. There are currently no refills at the pharmacy and no documentation of discontinuation.  Lisle Slocumb Student - PharmD.

## 2024-11-03 ENCOUNTER — Ambulatory Visit: Admitting: Urology
